# Patient Record
Sex: Female | Born: 1960
Health system: Southern US, Community
[De-identification: ages and names within clinical notes are randomized; demographics above are authoritative.]

## PROBLEM LIST (undated history)

## (undated) DIAGNOSIS — I10 Essential (primary) hypertension: Secondary | ICD-10-CM

---

## 2015-06-09 ENCOUNTER — Emergency Department (HOSPITAL_COMMUNITY): Payer: Self-pay

## 2015-06-09 ENCOUNTER — Observation Stay (HOSPITAL_COMMUNITY)
Admission: EM | Admit: 2015-06-09 | Discharge: 2015-06-12 | Disposition: A | Discharge status: Home / Self Care | Payer: Self-pay | Attending: Family Medicine | Admitting: Family Medicine

## 2015-06-09 ENCOUNTER — Encounter (HOSPITAL_COMMUNITY): Payer: Self-pay

## 2015-06-09 DIAGNOSIS — G459 Transient cerebral ischemic attack, unspecified: Principal | ICD-10-CM | POA: Diagnosis present

## 2015-06-09 DIAGNOSIS — Z66 Do not resuscitate: Secondary | ICD-10-CM | POA: Insufficient documentation

## 2015-06-09 DIAGNOSIS — E785 Hyperlipidemia, unspecified: Secondary | ICD-10-CM | POA: Insufficient documentation

## 2015-06-09 DIAGNOSIS — Q2112 Patent foramen ovale: Secondary | ICD-10-CM

## 2015-06-09 DIAGNOSIS — E669 Obesity, unspecified: Secondary | ICD-10-CM | POA: Insufficient documentation

## 2015-06-09 DIAGNOSIS — I10 Essential (primary) hypertension: Secondary | ICD-10-CM | POA: Insufficient documentation

## 2015-06-09 DIAGNOSIS — I161 Hypertensive emergency: Secondary | ICD-10-CM | POA: Insufficient documentation

## 2015-06-09 DIAGNOSIS — Z6831 Body mass index (BMI) 31.0-31.9, adult: Secondary | ICD-10-CM | POA: Insufficient documentation

## 2015-06-09 DIAGNOSIS — R2990 Unspecified symptoms and signs involving the nervous system: Secondary | ICD-10-CM | POA: Diagnosis present

## 2015-06-09 DIAGNOSIS — Q211 Atrial septal defect: Secondary | ICD-10-CM

## 2015-06-09 HISTORY — DX: Essential (primary) hypertension: I10

## 2015-06-09 LAB — DIFFERENTIAL
Basophils Absolute: 0 10*3/uL (ref 0.0–0.1)
Basophils Relative: 1 %
EOS PCT: 1 %
Eosinophils Absolute: 0.1 10*3/uL (ref 0.0–0.7)
LYMPHS ABS: 2.1 10*3/uL (ref 0.7–4.0)
LYMPHS PCT: 49 %
Monocytes Absolute: 0.3 10*3/uL (ref 0.1–1.0)
Monocytes Relative: 7 %
NEUTROS PCT: 42 %
Neutro Abs: 1.8 10*3/uL (ref 1.7–7.7)

## 2015-06-09 LAB — CBC
HCT: 37.3 % (ref 36.0–46.0)
HCT: 37 % (ref 36.0–46.0)
HEMOGLOBIN: 11.9 g/dL — AB (ref 12.0–15.0)
Hemoglobin: 12.2 g/dL (ref 12.0–15.0)
MCH: 28.6 pg (ref 26.0–34.0)
MCH: 29.4 pg (ref 26.0–34.0)
MCHC: 31.9 g/dL (ref 30.0–36.0)
MCHC: 33 g/dL (ref 30.0–36.0)
MCV: 89.7 fL (ref 78.0–100.0)
MCV: 89.2 fL (ref 78.0–100.0)
PLATELETS: 177 10*3/uL (ref 150–400)
Platelets: 182 10*3/uL (ref 150–400)
RBC: 4.16 MIL/uL (ref 3.87–5.11)
RBC: 4.15 MIL/uL (ref 3.87–5.11)
RDW: 13.5 % (ref 11.5–15.5)
RDW: 13.4 % (ref 11.5–15.5)
WBC: 4.3 10*3/uL (ref 4.0–10.5)
WBC: 4.8 10*3/uL (ref 4.0–10.5)

## 2015-06-09 LAB — RAPID URINE DRUG SCREEN, HOSP PERFORMED
AMPHETAMINES: NOT DETECTED
BARBITURATES: NOT DETECTED
Benzodiazepines: NOT DETECTED
Cocaine: NOT DETECTED
Opiates: NOT DETECTED
TETRAHYDROCANNABINOL: NOT DETECTED

## 2015-06-09 LAB — COMPREHENSIVE METABOLIC PANEL
ALBUMIN: 3.9 g/dL (ref 3.5–5.0)
ALK PHOS: 70 U/L (ref 38–126)
ALT: 22 U/L (ref 14–54)
ANION GAP: 10 (ref 5–15)
AST: 19 U/L (ref 15–41)
BILIRUBIN TOTAL: 0.5 mg/dL (ref 0.3–1.2)
BUN: 17 mg/dL (ref 6–20)
CALCIUM: 9.5 mg/dL (ref 8.9–10.3)
CO2: 24 mmol/L (ref 22–32)
CREATININE: 1.03 mg/dL — AB (ref 0.44–1.00)
Chloride: 107 mmol/L (ref 101–111)
GFR calc Af Amer: >60 mL/min (ref >60)
GFR calc non Af Amer: >60 mL/min (ref >60)
GLUCOSE: 108 mg/dL — AB (ref 65–99)
Potassium: 3.9 mmol/L (ref 3.5–5.1)
Sodium: 141 mmol/L (ref 135–145)
TOTAL PROTEIN: 7.4 g/dL (ref 6.5–8.1)

## 2015-06-09 LAB — URINALYSIS, ROUTINE W REFLEX MICROSCOPIC
BILIRUBIN URINE: NEGATIVE
Glucose, UA: NEGATIVE mg/dL
Hgb urine dipstick: NEGATIVE
KETONES UR: NEGATIVE mg/dL
NITRITE: NEGATIVE
PROTEIN: NEGATIVE mg/dL
Specific Gravity, Urine: 1.013 (ref 1.005–1.030)
pH: 6.5 (ref 5.0–8.0)

## 2015-06-09 LAB — PROTIME-INR
INR: 1 (ref 0.00–1.49)
PROTHROMBIN TIME: 13.4 s (ref 11.6–15.2)

## 2015-06-09 LAB — I-STAT CHEM 8, ED
BUN: 18 mg/dL (ref 6–20)
CREATININE: 1 mg/dL (ref 0.44–1.00)
Calcium, Ion: 1.23 mmol/L (ref 1.12–1.23)
Chloride: 106 mmol/L (ref 101–111)
Glucose, Bld: 108 mg/dL — ABNORMAL HIGH (ref 65–99)
HEMATOCRIT: 38 % (ref 36.0–46.0)
HEMOGLOBIN: 12.9 g/dL (ref 12.0–15.0)
Potassium: 4 mmol/L (ref 3.5–5.1)
SODIUM: 142 mmol/L (ref 135–145)
TCO2: 26 mmol/L (ref 0–100)

## 2015-06-09 LAB — URINE MICROSCOPIC-ADD ON: RBC / HPF: NONE SEEN RBC/hpf (ref 0–5)

## 2015-06-09 LAB — I-STAT TROPONIN, ED: Troponin i, poc: 0 ng/mL (ref 0.00–0.08)

## 2015-06-09 LAB — HEPATIC FUNCTION PANEL
ALBUMIN: 4.1 g/dL (ref 3.5–5.0)
ALT: 24 U/L (ref 14–54)
AST: 22 U/L (ref 15–41)
Alkaline Phosphatase: 72 U/L (ref 38–126)
Total Bilirubin: 0.6 mg/dL (ref 0.3–1.2)
Total Protein: 8 g/dL (ref 6.5–8.1)

## 2015-06-09 LAB — CK: Total CK: 169 U/L (ref 38–234)

## 2015-06-09 LAB — AMMONIA: Ammonia: 32 umol/L (ref 9–35)

## 2015-06-09 LAB — APTT: aPTT: 32 seconds (ref 24–37)

## 2015-06-09 LAB — I-STAT CG4 LACTIC ACID, ED
LACTIC ACID, VENOUS: 0.87 mmol/L (ref 0.5–2.0)
Lactic Acid, Venous: 0.64 mmol/L (ref 0.5–2.0)

## 2015-06-09 LAB — CREATININE, SERUM: CREATININE: 0.94 mg/dL (ref 0.44–1.00)

## 2015-06-09 LAB — ETHANOL

## 2015-06-09 MED ORDER — ONDANSETRON HCL 4 MG/2ML IJ SOLN: 4.0000 mg | Freq: Once | INTRAMUSCULAR | Status: DC

## 2015-06-09 MED ORDER — LABETALOL HCL 100 MG PO TABS
100.0000 mg | ORAL_TABLET | Freq: Two times a day (BID) | ORAL | Status: DC
  Administered 2015-06-09 – 2015-06-12 (×7): 100 mg via ORAL
  Filled 2015-06-09 (×7): qty 1

## 2015-06-09 MED ORDER — SENNOSIDES-DOCUSATE SODIUM 8.6-50 MG PO TABS: 1.0000 | ORAL_TABLET | Freq: Every evening | ORAL | Status: DC | PRN

## 2015-06-09 MED ORDER — HYDRALAZINE HCL 20 MG/ML IJ SOLN: 10.0000 mg | Freq: Four times a day (QID) | INTRAMUSCULAR | Status: DC | PRN

## 2015-06-09 MED ORDER — GADOBENATE DIMEGLUMINE 529 MG/ML IV SOLN
18.0000 mL | Freq: Once | INTRAVENOUS | Status: AC | PRN
  Administered 2015-06-09: 18 mL via INTRAVENOUS

## 2015-06-09 MED ORDER — ENOXAPARIN SODIUM 40 MG/0.4ML ~~LOC~~ SOLN
40.0000 mg | SUBCUTANEOUS | Status: DC
Start: 2015-06-09 — End: 2015-06-12
  Administered 2015-06-09 – 2015-06-11 (×3): 40 mg via SUBCUTANEOUS
  Filled 2015-06-09 (×3): qty 0.4

## 2015-06-09 MED ORDER — ACETAMINOPHEN 325 MG PO TABS
650.0000 mg | ORAL_TABLET | Freq: Four times a day (QID) | ORAL | Status: DC | PRN
  Administered 2015-06-09 – 2015-06-11 (×2): 650 mg via ORAL
  Filled 2015-06-09 (×2): qty 2

## 2015-06-09 MED ORDER — STROKE: EARLY STAGES OF RECOVERY BOOK
Freq: Once | Status: AC
  Administered 2015-06-10: 05:00:00
  Filled 2015-06-09 (×2): qty 1

## 2015-06-09 NOTE — Progress Notes (Signed)
Patient admitted to the unit from ER. Patient alert and oriented x 4. Patient oriented to room amd made comfortable. Tele placed.

## 2015-06-09 NOTE — ED Notes (Signed)
Pt. Woke up at 330 am and had a coughing spell went to bed and then again woke up this am and had another coughing spell.  She then had her friend check her BP and it was elevated and then the symptoms began around 10:35.  She was having lt. Arm numbness and lt. Lateral neck tightness and also had an episode of not able to talk or smile.  When pt. Arrived she reports that all of her symptoms have resolved.  She does report having dizziness with movement. She also has an area on her lt. Cheek that feels tight.Marland Kitchen. Speech is clear Moving all extremeties. Alert and oriented X 4.  Dr. Madilyn Hookees at the bedside.,

## 2015-06-09 NOTE — ED Provider Notes (Signed)
CSN: [id number]     Arrival date & time 06/09/15  1142 History   First MD Initiated Contact with Patient 06/09/15 1152     Chief Complaint  Patient presents with  . Numbness     The history is provided by the patient and the EMS personnel. No language interpreter was used.   Dorothy RabonRene Vassey is a 55 y.o. female who presents to the Emergency Department complaining of numbness.  At 3:30 this morning she awoke with a coughing spell that that resolved and she went back to sleep. She awoke again about 9:30 with a second coughing fits. She was concerned this may be related to her blood pressure and had it checked by her friend and it was noted to be 185/110. About 10:30 she developed what she describes as a crick in her neck where she could not move her neck to the side. Along with that sensation she had numbness and weakness in her left face, arm, leg. She had difficulty speaking at that time. Symptoms have resolved by the time she came to the emergency department except for a slight funny feeling in her left cheek. She does have some associated nausea and dizziness. She has a history of high blood pressure that she treats with homeopathic agents since she has a history of adrenal problems and allergy to antihypertensives.  No past medical history on file. No past surgical history on file. No family history on file. Social History  Substance Use Topics  . Smoking status: Not on file  . Smokeless tobacco: Not on file  . Alcohol Use: Not on file   OB History    No data available     Review of Systems  All other systems reviewed and are negative.     Allergies  Iodine; Latex; and Penicillins  Home Medications   Prior to Admission medications   Not on File   BP 223/125 mmHg  Pulse 62  Temp(Src) 98.1 F (36.7 C) (Oral)  Resp 16  SpO2 99%  LMP  (LMP Unknown) Physical Exam  Constitutional: She is oriented to person, place, and time. She appears well-developed and well-nourished.   HENT:  Head: Normocephalic and atraumatic.  Eyes: EOM are normal. Pupils are equal, round, and reactive to light.  Cardiovascular: Normal rate and regular rhythm.   No murmur heard. Pulmonary/Chest: Effort normal and breath sounds normal. No respiratory distress.  Abdominal: Soft. There is no tenderness. There is no rebound and no guarding.  Musculoskeletal: She exhibits no edema or tenderness.  Neurological: She is alert and oriented to person, place, and time. No cranial nerve deficit. Coordination normal.  5 out of 5 strength in all 4 extremities. Sensation to light touch intact in all 4 extremities.  Skin: Skin is warm and dry.  Psychiatric: She has a normal mood and affect. Her behavior is normal.  Nursing note and vitals reviewed.   ED Course  Procedures (including critical care time) Labs Review Labs Reviewed  CBC - Abnormal; Notable for the following:    Hemoglobin 11.9 (*)    All other components within normal limits  COMPREHENSIVE METABOLIC PANEL - Abnormal; Notable for the following:    Glucose, Bld 108 (*)    Creatinine, Ser 1.03 (*)    All other components within normal limits  URINALYSIS, ROUTINE W REFLEX MICROSCOPIC (NOT [facility]) - Abnormal; Notable for the following:    Leukocytes, UA TRACE (*)    All other components within normal limits  HEPATIC FUNCTION  PANEL - Abnormal; Notable for the following:    Bilirubin, Direct <0.1 (*)    All other components within normal limits  URINE MICROSCOPIC-ADD ON - Abnormal; Notable for the following:    Squamous Epithelial / LPF 6-30 (*)    Bacteria, UA FEW (*)    All other components within normal limits  I-STAT CHEM 8, ED - Abnormal; Notable for the following:    Glucose, Bld 108 (*)    All other components within normal limits  ETHANOL  PROTIME-INR  APTT  DIFFERENTIAL  URINE RAPID DRUG SCREEN, HOSP PERFORMED  AMMONIA  CK  I-STAT TROPOININ, ED  I-STAT CG4 LACTIC ACID, ED  I-STAT CG4 LACTIC ACID, ED    Imaging  Review Dg Chest 2 View  06/09/2015  CLINICAL DATA:  Possible TIA this morning, eyes rolling into back of head, LEFT face and arm numbness, cough EXAM: CHEST  2 VIEW COMPARISON:  None FINDINGS: Upper normal heart size. Mediastinal contours and pulmonary vascularity normal. Atherosclerotic calcification aorta. Faint density projecting over LEFT upper lobe likely an EKG lead. No definite infiltrate, pleural effusion or pneumothorax. Bones unremarkable. IMPRESSION: No acute abnormalities. Electronically Signed   By: Ulyses Southward M.D.   On: 06/09/2015 14:31   Ct Head Wo Contrast  06/09/2015  CLINICAL DATA:  Left arm weakness and left-sided facial tingling beginning approximately at 3:30 this morning. Severe hypertension noted at the time of presentation. EXAM: CT HEAD WITHOUT CONTRAST TECHNIQUE: Contiguous axial images were obtained from the base of the skull through the vertex without intravenous contrast. COMPARISON:  None. FINDINGS: Examination is degraded secondary to patient motion artifact necessitating the acquisition of additional images. Gray-white differentiation is maintained. No CT evidence of acute large territory infarct. No intraparenchymal or extra-axial mass or hemorrhage. Normal size and configuration of the ventricles and basilar cisterns. No midline shift. There is underpneumatization of the bilateral frontal sinuses. The remaining paranasal sinuses and mastoid air cells are normally aerated. No air-fluid levels. Regional soft tissues appear normal. No displaced calvarial fracture. IMPRESSION: Negative noncontrast head CT. Electronically Signed   By: Simonne Come M.D.   On: 06/09/2015 12:35   Mr Maxine Glenn Head Wo Contrast  06/09/2015  CLINICAL DATA:  Left arm numbness and left neck pain beginning 6 hours ago. Some dizziness. EXAM: MR HEAD WITHOUT CONTRAST MR CIRCLE OF WILLIS WITHOUT CONTRAST MRA OF THE NECK WITHOUT AND WITH CONTRAST TECHNIQUE: Multiplanar, multiecho pulse sequences of the brain and  surrounding structures were obtained according to standard protocol without intravenous contrast.; Multiplanar and multiecho pulse sequences of the neck were obtained without and with intravenous contrast. Angiographic images of the neck were obtained using MRA technique without and with intravenous contast.; Angiographic images of the Circle of Willis were obtained using MRA technique without intravenous contrast. CONTRAST:  18mL MULTIHANCE GADOBENATE DIMEGLUMINE 529 MG/ML IV SOLN COMPARISON:  CT same day FINDINGS: MR HEAD FINDINGS Diffusion imaging does not show any acute or subacute infarction. There are mild chronic appearing small vessel changes of the brainstem, thalami and hemispheric white matter. Punctate foci of hemosiderin are present in the region of those infarctions. No cortical or large vessel territory infarction. No mass lesion, acute hemorrhage, hydrocephalus or extra-axial collection. No pituitary mass. No inflammatory sinus disease. MR CIRCLE OF WILLIS FINDINGS Both internal carotid arteries are widely patent into the brain. No siphon stenosis. The anterior and middle cerebral vessels are normal without proximal stenosis, aneurysm or vascular malformation. Both vertebral arteries are normal with the left  being dominant. No basilar stenosis. Posterior circulation branch vessels are normal. Right PCA takes a fetal origin from the anterior circulation. MRA NECK FINDINGS Branching pattern of the brachiocephalic vessels from the arch is normal. No origin stenosis. Both common carotid arteries are widely patent to their respective bifurcation. No carotid bifurcation narrowing or irregularity. Cervical internal carotid arteries are normal. Both vertebral arteries are normal without origin stenosis. No narrowing or irregularity. IMPRESSION: No acute brain finding. Chronic small-vessel ischemic changes of a mild degree. Punctate hemosiderin deposition associated with many of the old small vessel insults.  No sign of acute hemorrhage. Normal intracranial MR angiography of the large and medium size vessels. Normal MR angiography of the neck vessels. Electronically Signed   By: Paulina Fusi M.D.   On: 06/09/2015 14:32   Mr Angiogram Neck W Wo Contrast  06/09/2015  CLINICAL DATA:  Left arm numbness and left neck pain beginning 6 hours ago. Some dizziness. EXAM: MR HEAD WITHOUT CONTRAST MR CIRCLE OF WILLIS WITHOUT CONTRAST MRA OF THE NECK WITHOUT AND WITH CONTRAST TECHNIQUE: Multiplanar, multiecho pulse sequences of the brain and surrounding structures were obtained according to standard protocol without intravenous contrast.; Multiplanar and multiecho pulse sequences of the neck were obtained without and with intravenous contrast. Angiographic images of the neck were obtained using MRA technique without and with intravenous contast.; Angiographic images of the Circle of Willis were obtained using MRA technique without intravenous contrast. CONTRAST:  18mL MULTIHANCE GADOBENATE DIMEGLUMINE 529 MG/ML IV SOLN COMPARISON:  CT same day FINDINGS: MR HEAD FINDINGS Diffusion imaging does not show any acute or subacute infarction. There are mild chronic appearing small vessel changes of the brainstem, thalami and hemispheric white matter. Punctate foci of hemosiderin are present in the region of those infarctions. No cortical or large vessel territory infarction. No mass lesion, acute hemorrhage, hydrocephalus or extra-axial collection. No pituitary mass. No inflammatory sinus disease. MR CIRCLE OF WILLIS FINDINGS Both internal carotid arteries are widely patent into the brain. No siphon stenosis. The anterior and middle cerebral vessels are normal without proximal stenosis, aneurysm or vascular malformation. Both vertebral arteries are normal with the left being dominant. No basilar stenosis. Posterior circulation branch vessels are normal. Right PCA takes a fetal origin from the anterior circulation. MRA NECK FINDINGS  Branching pattern of the brachiocephalic vessels from the arch is normal. No origin stenosis. Both common carotid arteries are widely patent to their respective bifurcation. No carotid bifurcation narrowing or irregularity. Cervical internal carotid arteries are normal. Both vertebral arteries are normal without origin stenosis. No narrowing or irregularity. IMPRESSION: No acute brain finding. Chronic small-vessel ischemic changes of a mild degree. Punctate hemosiderin deposition associated with many of the old small vessel insults. No sign of acute hemorrhage. Normal intracranial MR angiography of the large and medium size vessels. Normal MR angiography of the neck vessels. Electronically Signed   By: Paulina Fusi M.D.   On: 06/09/2015 14:32   Mr Brain Wo Contrast  06/09/2015  CLINICAL DATA:  Left arm numbness and left neck pain beginning 6 hours ago. Some dizziness. EXAM: MR HEAD WITHOUT CONTRAST MR CIRCLE OF WILLIS WITHOUT CONTRAST MRA OF THE NECK WITHOUT AND WITH CONTRAST TECHNIQUE: Multiplanar, multiecho pulse sequences of the brain and surrounding structures were obtained according to standard protocol without intravenous contrast.; Multiplanar and multiecho pulse sequences of the neck were obtained without and with intravenous contrast. Angiographic images of the neck were obtained using MRA technique without and with intravenous contast.; Angiographic images  of the Circle of Willis were obtained using MRA technique without intravenous contrast. CONTRAST:  18mL MULTIHANCE GADOBENATE DIMEGLUMINE 529 MG/ML IV SOLN COMPARISON:  CT same day FINDINGS: MR HEAD FINDINGS Diffusion imaging does not show any acute or subacute infarction. There are mild chronic appearing small vessel changes of the brainstem, thalami and hemispheric white matter. Punctate foci of hemosiderin are present in the region of those infarctions. No cortical or large vessel territory infarction. No mass lesion, acute hemorrhage, hydrocephalus  or extra-axial collection. No pituitary mass. No inflammatory sinus disease. MR CIRCLE OF WILLIS FINDINGS Both internal carotid arteries are widely patent into the brain. No siphon stenosis. The anterior and middle cerebral vessels are normal without proximal stenosis, aneurysm or vascular malformation. Both vertebral arteries are normal with the left being dominant. No basilar stenosis. Posterior circulation branch vessels are normal. Right PCA takes a fetal origin from the anterior circulation. MRA NECK FINDINGS Branching pattern of the brachiocephalic vessels from the arch is normal. No origin stenosis. Both common carotid arteries are widely patent to their respective bifurcation. No carotid bifurcation narrowing or irregularity. Cervical internal carotid arteries are normal. Both vertebral arteries are normal without origin stenosis. No narrowing or irregularity. IMPRESSION: No acute brain finding. Chronic small-vessel ischemic changes of a mild degree. Punctate hemosiderin deposition associated with many of the old small vessel insults. No sign of acute hemorrhage. Normal intracranial MR angiography of the large and medium size vessels. Normal MR angiography of the neck vessels. Electronically Signed   By: Paulina Fusi M.D.   On: 06/09/2015 14:32   I have personally reviewed and evaluated these images and lab results as part of my medical decision-making.   EKG Interpretation   Date/Time:  Saturday June 09 2015 11:57:45 EST Ventricular Rate:  69 PR Interval:  163 QRS Duration: 96 QT Interval:  434 QTC Calculation: 465 R Axis:   57 Text Interpretation:  Sinus rhythm Probable left atrial enlargement Left  ventricular hypertrophy Anterior ST elevation, probably due to LVH  Baseline wander in lead(s) V3 Confirmed by Lincoln Brigham [phone]) on 06/09/2015  2:42:38 PM      MDM   Final diagnoses:  Stroke-like symptoms    Patient here for evaluation of left sided weakness that resolved on ED  arrival. She did have an altered to some GERD sensation in her left face that has continued to resolve over time. She has an NIH score of 0. She was markedly hypertensive on ED arrival and her blood pressure continues to improve. She did present with some nausea and dizziness and these symptoms have also improved during her ED stay. The patient has been evaluated by Dr. Lavon Paganini with the neurology service, plan to admit for observation for TIA workup and continue to follow her blood pressures.  Tilden Fossa, MD 06/09/15 1535

## 2015-06-09 NOTE — ED Notes (Signed)
Pt. Eating peanut butter and crackers, cold water also given

## 2015-06-09 NOTE — ED Notes (Signed)
Dr.  Burnis Kingfisherizwan  Gave a verbal order to go ahead and give her labetalol 100 mg po now , (The dose was ordered for 2200)

## 2015-06-09 NOTE — ED Notes (Signed)
Pt. Ambulated in the room for Dr. Lavon PaganiniNandigam, Pt.s gait was steady, Pt. Denies any dizziness

## 2015-06-09 NOTE — H&P (Addendum)
[facility] History and Physical  Azuri Bozard WUJ:[id number] DOB: 1960-12-23 DOA: 06/09/2015   PCP: No primary care provider on file.    Chief Complaint: left sided numbness and weakness  HPI: Mitsuye Schrodt is a 55 y.o. female with HTN who takes holistic medications presents with an onset of loss of consciousness, left face arm and leg numbness and weakness. The patient was with her friends and did not feel well early this morning. They checked her blood pressure and noted that systolic was greater than 190. They were assisting her and laying down in bed when she complained of numbness of her left face followed by numbness and tingling of her left arm. Subsequently, her eyes rolled back and she became semi-conscious. She does not remember what happened after this. Her friends noted left facial droop and flaccidity and left arm and left leg. EMS was called and she was brought to the hospital. By the time she arrived to the ER her symptoms had resolved. Currently she is asymptomatic other than a mild headache. She has high blood pressure and it has been under control with holistic medicines up until 3 weeks ago when she started noticing that it was elevated. No history of stroke or MI in the past.      General: The patient denies anorexia, fever, + 20 pound weight gain in about 4 months Cardiac: Denies chest pain, syncope,pedal edema - she often has palpitations- she had some chest discomfort about a month ago but not recently Respiratory: She has had cough with congestion and runny nose for the past few days but as of today these symptoms have resolved GI: Denies severe indigestion/heartburn, abdominal pain, vomiting, diarrhea and constipation- she has had nausea but no vomiting or diarrhea GU: Denies hematuria, incontinence, dysuria  Musculoskeletal: Denies arthritis  Skin: Denies suspicious skin lesions Neurologic: Per history of present illness Psychiatry: Denies depression or  anxiety. Hematologic: no easy bruising or bleeding  All other systems reviewed and found to be negative.  No past medical history on file.  No past surgical history on file.  Social History: She does not smoke or drink alcohol Currently is living with her friend and is not working. Allergies  Allergen Reactions  . Iodine Anaphylaxis  . Latex Anaphylaxis  . Penicillins Anaphylaxis  Lisinopril causes anaphylactic shock Amlodipine makes her feel bad  Family history:  Both of her parents are diabetics    Prior to Admission medications - she takes a number of holistic medications which include magnesium   Not on File     Physical Exam: Filed Vitals:   06/09/15 1155 06/09/15 1158 06/09/15 1245 06/09/15 1409  BP: 224/117 224/117 223/125   Pulse: 71 67 62   Temp: 98 F (36.7 C)   98.1 F (36.7 C)  TempSrc: Oral     Resp: SpO2: 99% 99% 99%      General: Awake alert oriented 3, no acute distress HEENT: Normocephalic and Atraumatic, Mucous membranes pink                PERRLA; EOM intact; No scleral icterus,                 Nares: Patent, Oropharynx: Clear, Fair Dentition                 Neck: FROM, no cervical lymphadenopathy, thyromegaly, carotid bruit or JVD;  Breasts: deferred CHEST WALL: No tenderness  CHEST: Normal respiration, clear to auscultation bilaterally  HEART: Regular rate and rhythm; no murmurs rubs or gallops  BACK: No kyphosis or scoliosis; no CVA tenderness  GI: Positive Bowel Sounds, soft, non-tender; no masses, no organomegaly Rectal Exam: deferred MSK: No cyanosis, clubbing, or edema Genitalia: not examined  SKIN:  no rash or ulceration  CNS: Alert and Oriented x 4, Nonfocal exam, CN 2-12 intact  Labs on Admission:  Basic Metabolic Panel:  Recent Labs Lab 06/09/15 1248 06/09/15 1259  NA 141 142  K 3.9 4.0  CL 107 106  CO2 24  --   GLUCOSE 108* 108*  BUN 17 18  CREATININE 1.03* 1.00  CALCIUM 9.5  --    Liver Function  Tests:  Recent Labs Lab 06/09/15 1248  AST 19  22  ALT 22  24  ALKPHOS 70  72  BILITOT 0.5  0.6  PROT 7.4  8.0  ALBUMIN 3.9  4.1   No results for input(s): LIPASE, AMYLASE in the last 168 hours.  Recent Labs Lab 06/09/15 1248  AMMONIA 32   CBC:  Recent Labs Lab 06/09/15 1248 06/09/15 1259  WBC 4.3  --   NEUTROABS 1.8  --   HGB 11.9* 12.9  HCT 37.3 38.0  MCV 89.7  --   PLT 182  --    Cardiac Enzymes:  Recent Labs Lab 06/09/15 1248  CKTOTAL 169    BNP (last 3 results) No results for input(s): BNP in the last 8760 hours.  ProBNP (last 3 results) No results for input(s): PROBNP in the last 8760 hours.  CBG: No results for input(s): GLUCAP in the last 168 hours.  Radiological Exams on Admission: Dg Chest 2 View  06/09/2015  CLINICAL DATA:  Possible TIA this morning, eyes rolling into back of head, LEFT face and arm numbness, cough EXAM: CHEST  2 VIEW COMPARISON:  None FINDINGS: Upper normal heart size. Mediastinal contours and pulmonary vascularity normal. Atherosclerotic calcification aorta. Faint density projecting over LEFT upper lobe likely an EKG lead. No definite infiltrate, pleural effusion or pneumothorax. Bones unremarkable. IMPRESSION: No acute abnormalities. Electronically Signed   By: Ulyses SouthwardMark  Boles M.D.   On: 06/09/2015 14:31   Ct Head Wo Contrast  06/09/2015  CLINICAL DATA:  Left arm weakness and left-sided facial tingling beginning approximately at 3:30 this morning. Severe hypertension noted at the time of presentation. EXAM: CT HEAD WITHOUT CONTRAST TECHNIQUE: Contiguous axial images were obtained from the base of the skull through the vertex without intravenous contrast. COMPARISON:  None. FINDINGS: Examination is degraded secondary to patient motion artifact necessitating the acquisition of additional images. Gray-white differentiation is maintained. No CT evidence of acute large territory infarct. No intraparenchymal or extra-axial mass or  hemorrhage. Normal size and configuration of the ventricles and basilar cisterns. No midline shift. There is underpneumatization of the bilateral frontal sinuses. The remaining paranasal sinuses and mastoid air cells are normally aerated. No air-fluid levels. Regional soft tissues appear normal. No displaced calvarial fracture. IMPRESSION: Negative noncontrast head CT. Electronically Signed   By: Simonne ComeJohn  Watts M.D.   On: 06/09/2015 12:35   Mr Maxine GlennMra Head Wo Contrast  06/09/2015  CLINICAL DATA:  Left arm numbness and left neck pain beginning 6 hours ago. Some dizziness. EXAM: MR HEAD WITHOUT CONTRAST MR CIRCLE OF WILLIS WITHOUT CONTRAST MRA OF THE NECK WITHOUT AND WITH CONTRAST TECHNIQUE: Multiplanar, multiecho pulse sequences of the brain and surrounding structures were obtained according to standard protocol without intravenous contrast.; Multiplanar and multiecho pulse sequences of the neck were obtained  without and with intravenous contrast. Angiographic images of the neck were obtained using MRA technique without and with intravenous contast.; Angiographic images of the Circle of Willis were obtained using MRA technique without intravenous contrast. CONTRAST:  18mL MULTIHANCE GADOBENATE DIMEGLUMINE 529 MG/ML IV SOLN COMPARISON:  CT same day FINDINGS: MR HEAD FINDINGS Diffusion imaging does not show any acute or subacute infarction. There are mild chronic appearing small vessel changes of the brainstem, thalami and hemispheric white matter. Punctate foci of hemosiderin are present in the region of those infarctions. No cortical or large vessel territory infarction. No mass lesion, acute hemorrhage, hydrocephalus or extra-axial collection. No pituitary mass. No inflammatory sinus disease. MR CIRCLE OF WILLIS FINDINGS Both internal carotid arteries are widely patent into the brain. No siphon stenosis. The anterior and middle cerebral vessels are normal without proximal stenosis, aneurysm or vascular malformation. Both  vertebral arteries are normal with the left being dominant. No basilar stenosis. Posterior circulation branch vessels are normal. Right PCA takes a fetal origin from the anterior circulation. MRA NECK FINDINGS Branching pattern of the brachiocephalic vessels from the arch is normal. No origin stenosis. Both common carotid arteries are widely patent to their respective bifurcation. No carotid bifurcation narrowing or irregularity. Cervical internal carotid arteries are normal. Both vertebral arteries are normal without origin stenosis. No narrowing or irregularity. IMPRESSION: No acute brain finding. Chronic small-vessel ischemic changes of a mild degree. Punctate hemosiderin deposition associated with many of the old small vessel insults. No sign of acute hemorrhage. Normal intracranial MR angiography of the large and medium size vessels. Normal MR angiography of the neck vessels. Electronically Signed   By: Paulina Fusi M.D.   On: 06/09/2015 14:32   Mr Angiogram Neck W Wo Contrast  06/09/2015  CLINICAL DATA:  Left arm numbness and left neck pain beginning 6 hours ago. Some dizziness. EXAM: MR HEAD WITHOUT CONTRAST MR CIRCLE OF WILLIS WITHOUT CONTRAST MRA OF THE NECK WITHOUT AND WITH CONTRAST TECHNIQUE: Multiplanar, multiecho pulse sequences of the brain and surrounding structures were obtained according to standard protocol without intravenous contrast.; Multiplanar and multiecho pulse sequences of the neck were obtained without and with intravenous contrast. Angiographic images of the neck were obtained using MRA technique without and with intravenous contast.; Angiographic images of the Circle of Willis were obtained using MRA technique without intravenous contrast. CONTRAST:  18mL MULTIHANCE GADOBENATE DIMEGLUMINE 529 MG/ML IV SOLN COMPARISON:  CT same day FINDINGS: MR HEAD FINDINGS Diffusion imaging does not show any acute or subacute infarction. There are mild chronic appearing small vessel changes of the  brainstem, thalami and hemispheric white matter. Punctate foci of hemosiderin are present in the region of those infarctions. No cortical or large vessel territory infarction. No mass lesion, acute hemorrhage, hydrocephalus or extra-axial collection. No pituitary mass. No inflammatory sinus disease. MR CIRCLE OF WILLIS FINDINGS Both internal carotid arteries are widely patent into the brain. No siphon stenosis. The anterior and middle cerebral vessels are normal without proximal stenosis, aneurysm or vascular malformation. Both vertebral arteries are normal with the left being dominant. No basilar stenosis. Posterior circulation branch vessels are normal. Right PCA takes a fetal origin from the anterior circulation. MRA NECK FINDINGS Branching pattern of the brachiocephalic vessels from the arch is normal. No origin stenosis. Both common carotid arteries are widely patent to their respective bifurcation. No carotid bifurcation narrowing or irregularity. Cervical internal carotid arteries are normal. Both vertebral arteries are normal without origin stenosis. No narrowing or irregularity. IMPRESSION: No  acute brain finding. Chronic small-vessel ischemic changes of a mild degree. Punctate hemosiderin deposition associated with many of the old small vessel insults. No sign of acute hemorrhage. Normal intracranial MR angiography of the large and medium size vessels. Normal MR angiography of the neck vessels. Electronically Signed   By: Paulina Fusi M.D.   On: 06/09/2015 14:32   Mr Brain Wo Contrast  06/09/2015  CLINICAL DATA:  Left arm numbness and left neck pain beginning 6 hours ago. Some dizziness. EXAM: MR HEAD WITHOUT CONTRAST MR CIRCLE OF WILLIS WITHOUT CONTRAST MRA OF THE NECK WITHOUT AND WITH CONTRAST TECHNIQUE: Multiplanar, multiecho pulse sequences of the brain and surrounding structures were obtained according to standard protocol without intravenous contrast.; Multiplanar and multiecho pulse sequences of  the neck were obtained without and with intravenous contrast. Angiographic images of the neck were obtained using MRA technique without and with intravenous contast.; Angiographic images of the Circle of Willis were obtained using MRA technique without intravenous contrast. CONTRAST:  18mL MULTIHANCE GADOBENATE DIMEGLUMINE 529 MG/ML IV SOLN COMPARISON:  CT same day FINDINGS: MR HEAD FINDINGS Diffusion imaging does not show any acute or subacute infarction. There are mild chronic appearing small vessel changes of the brainstem, thalami and hemispheric white matter. Punctate foci of hemosiderin are present in the region of those infarctions. No cortical or large vessel territory infarction. No mass lesion, acute hemorrhage, hydrocephalus or extra-axial collection. No pituitary mass. No inflammatory sinus disease. MR CIRCLE OF WILLIS FINDINGS Both internal carotid arteries are widely patent into the brain. No siphon stenosis. The anterior and middle cerebral vessels are normal without proximal stenosis, aneurysm or vascular malformation. Both vertebral arteries are normal with the left being dominant. No basilar stenosis. Posterior circulation branch vessels are normal. Right PCA takes a fetal origin from the anterior circulation. MRA NECK FINDINGS Branching pattern of the brachiocephalic vessels from the arch is normal. No origin stenosis. Both common carotid arteries are widely patent to their respective bifurcation. No carotid bifurcation narrowing or irregularity. Cervical internal carotid arteries are normal. Both vertebral arteries are normal without origin stenosis. No narrowing or irregularity. IMPRESSION: No acute brain finding. Chronic small-vessel ischemic changes of a mild degree. Punctate hemosiderin deposition associated with many of the old small vessel insults. No sign of acute hemorrhage. Normal intracranial MR angiography of the large and medium size vessels. Normal MR angiography of the neck vessels.  Electronically Signed   By: Paulina Fusi M.D.   On: 06/09/2015 14:32    EKG: Independently reviewed. NSR with no ST or T wave changes  Assessment/Plan Principal Problem:   Stroke-like symptoms -Possibly TIA secondary to hypertension-MRI is negative for CVA -We'll continue neuro checks and follow-up on echo and carotid duplex -Check A1c and lipid panel  Active Problems:   Hypertensive emergency As she states she is allergic to lisinopril and amlodipine, we'll start labetalol and also order when necessary hydralazine goal blood pressure would be 160-180 systolic at this time   Consulted: Neurology consulted by ER  Code Status: DO NOT RESUSCITATE Family Communication:  DVT Prophylaxis: Lovenox  Time spent: 15  Violanda Bobeck, MD [facility]  If 7PM-7AM, please contact night-coverage [email or web address] 06/09/2015, 3:42 PM

## 2015-06-09 NOTE — Consult Note (Signed)
Requesting Physician: Dr. Madilyn Hook     Reason for consultation: Left facial and limb numbness Dorothy Gilbert, evaluate for stroke   HPI:                                                                                                                                         Dorothy Gilbert is an 55 y.o. female patient who presented The emergency room with symptoms of left face, upper and lower extremities weakness and numbness symptoms. She is not sure what time the symptoms started. She did not feel well when she woke up this morning and had some dizziness and numbness in her face. She woke up around 3:30 AM to go to bathroom and she felt normal at that time. The symptoms got worse roughly around 9 AM. Her friend checked her blood pressure at home which was highly elevated to 190 systolic. She also has some dizziness and presyncopal type symptoms. She has known history of hypertension but does not take any medications, instead she is on several supplemental and alternative medical therapies. Denies any vision or speech problems. Her symptoms are mostly resolved at the time of my evaluation.   Date last known well:  06/09/15 Time last known well:  Possibly around 3.30 am tPA Given: No: Non focal exam, unknown clear onset time, woke up with symptoms.   Stroke Risk Factors - hypertension  Past Medical History: No past medical history on file.  No past surgical history on file.  Family History: No family history on file.  Social History:   has no tobacco, alcohol, and drug history on file.  Allergies:  Allergies  Allergen Reactions  . Iodine Anaphylaxis  . Latex Anaphylaxis  . Penicillins Anaphylaxis     Medications:                                                                                                                         Current facility-administered medications:  .  ondansetron (ZOFRAN) injection 4 mg, 4 mg, Intravenous, Once, Tilden Fossa, MD No current outpatient prescriptions  on file.   ROS:  History obtained from the patient  General ROS: negative for - chills, fatigue, fever, night sweats, weight gain or weight loss Psychological ROS: negative for - behavioral disorder, hallucinations, memory difficulties, mood swings or suicidal ideation Ophthalmic ROS: negative for - blurry vision, double vision, eye pain or loss of vision ENT ROS: negative for - epistaxis, nasal discharge, oral lesions, sore throat, tinnitus or vertigo Allergy and Immunology ROS: negative for - hives or itchy/watery eyes Hematological and Lymphatic ROS: negative for - bleeding problems, bruising or swollen lymph nodes Endocrine ROS: negative for - galactorrhea, hair pattern changes, polydipsia/polyuria or temperature intolerance Respiratory ROS: negative for - cough, hemoptysis, shortness of breath or wheezing Cardiovascular ROS: negative for - chest pain, dyspnea on exertion, edema or irregular heartbeat Gastrointestinal ROS: negative for - abdominal pain, diarrhea, hematemesis, nausea/vomiting or stool incontinence Genito-Urinary ROS: negative for - dysuria, hematuria, incontinence or urinary frequency/urgency Musculoskeletal ROS: negative for - joint swelling or muscular weakness Neurological ROS: as noted in HPI Dermatological ROS: negative for rash and skin lesion changes  Neurologic Examination:                                                                                                      Blood pressure 224/117, pulse 71, temperature 98 F (36.7 C), temperature source Oral, resp. rate 18, SpO2 99 %.  Evaluation of higher integrative functions including: Level of alertness: Alert,  Oriented to time, place and person Speech: fluent, no evidence of dysarthria or aphasia noted.  Test the following cranial nerves: 2-12 grossly intact Motor  examination: Normal tone, bulk, full 5/5 motor strength in all 4 extremities Examination of sensation : Normal and symmetric sensation to pinprick in all 4 extremities and on face Examination of deep tendon reflexes: 2+, normal and symmetric in all extremities, normal plantars bilaterally Test coordination: Normal finger nose testing, with no evidence of limb appendicular ataxia or abnormal involuntary movements or tremors noted.  Gait:Within normal limits  Lab Results: Basic Metabolic Panel: No results for input(s): NA, K, CL, CO2, GLUCOSE, BUN, CREATININE, CALCIUM, MG, PHOS in the last 168 hours.  Liver Function Tests: No results for input(s): AST, ALT, ALKPHOS, BILITOT, PROT, ALBUMIN in the last 168 hours. No results for input(s): LIPASE, AMYLASE in the last 168 hours. No results for input(s): AMMONIA in the last 168 hours.  CBC: No results for input(s): WBC, NEUTROABS, HGB, HCT, MCV, PLT in the last 168 hours.  Cardiac Enzymes: No results for input(s): CKTOTAL, CKMB, CKMBINDEX, TROPONINI in the last 168 hours.  Lipid Panel: No results for input(s): CHOL, TRIG, HDL, CHOLHDL, VLDL, LDLCALC in the last 168 hours.  CBG: No results for input(s): GLUCAP in the last 168 hours.  Microbiology: No results found for this or any previous visit.   Imaging: Ct Head Wo Contrast  06/09/2015  CLINICAL DATA:  Left arm weakness and left-sided facial tingling beginning approximately at 3:30 this morning. Severe hypertension noted at the time of presentation. EXAM: CT HEAD WITHOUT CONTRAST TECHNIQUE: Contiguous axial images were obtained from the base of the skull through the  vertex without intravenous contrast. COMPARISON:  None. FINDINGS: Examination is degraded secondary to patient motion artifact necessitating the acquisition of additional images. Gray-white differentiation is maintained. No CT evidence of acute large territory infarct. No intraparenchymal or extra-axial mass or hemorrhage. Normal  size and configuration of the ventricles and basilar cisterns. No midline shift. There is underpneumatization of the bilateral frontal sinuses. The remaining paranasal sinuses and mastoid air cells are normally aerated. No air-fluid levels. Regional soft tissues appear normal. No displaced calvarial fracture. IMPRESSION: Negative noncontrast head CT. Electronically Signed   By: Simonne ComeJohn  Watts M.D.   On: 06/09/2015 12:35    Assessment and plan:   Dorothy Gilbert is an 55 y.o. female patient who presented with  history of left face and left upper and lower extremities numbness and weakness Symptoms earlier today, which have resolved at the time of my evaluation in the emergency room. Nonfocal neurological examination. Not a candidate for acute IV TPA since her neurological examination is nonfocal.  Her stroke risk factors would include poorly controlled hypertension for which she does not use any medications. She is on several supplemental and alternative treatments. Does not take aspirin at home. CT of the head was unremarkable.  Given her vascular risk factors, could possibly represent a TIA.  Recommend further workup with a brain MRI, and MRA of the head and neck.  At the time of finalizing this note, the studies are completed. They did not show any acute pathology. Chronic hypertensive microvascular ischemic changes were seen on the brain MRI study. Recommend starting aspirin 81 mg daily, and optimal management of her blood pressure. Check A1c, TSH, liver function tests, CK, lactate and lipid profile. Will be admitted to hospitalist service, telemetry monitoring overnight and echocardiogram. Physical therapy evaluation prior to discharge.  We'll follow up.

## 2015-06-09 NOTE — ED Notes (Signed)
Pt. Denies any dizziness or nausea at the present time

## 2015-06-09 NOTE — ED Notes (Signed)
Dr. Lavon PaganiniNandigam at the bedside

## 2015-06-10 ENCOUNTER — Observation Stay (HOSPITAL_BASED_OUTPATIENT_CLINIC_OR_DEPARTMENT_OTHER): Payer: Self-pay

## 2015-06-10 DIAGNOSIS — G459 Transient cerebral ischemic attack, unspecified: Secondary | ICD-10-CM

## 2015-06-10 LAB — LIPID PANEL
CHOL/HDL RATIO: 3.5 ratio
CHOLESTEROL: 195 mg/dL (ref 0–200)
HDL: 55 mg/dL (ref >40)
LDL CALC: 121 mg/dL — AB (ref 0–99)
Triglycerides: 93 mg/dL (ref <150)
VLDL: 19 mg/dL (ref 0–40)

## 2015-06-10 LAB — TSH: TSH: 1.365 u[IU]/mL (ref 0.350–4.500)

## 2015-06-10 MED ORDER — ASPIRIN EC 81 MG PO TBEC
81.0000 mg | DELAYED_RELEASE_TABLET | Freq: Every day | ORAL | Status: DC
  Administered 2015-06-10 – 2015-06-12 (×3): 81 mg via ORAL
  Filled 2015-06-10 (×3): qty 1

## 2015-06-10 MED ORDER — ATORVASTATIN CALCIUM 40 MG PO TABS
40.0000 mg | ORAL_TABLET | Freq: Every day | ORAL | Status: DC
  Filled 2015-06-10: qty 1

## 2015-06-10 NOTE — Progress Notes (Signed)
  Echocardiogram 2D Echocardiogram has been performed.  Delcie RochENNINGTON, Hideko Esselman 06/10/2015, 11:27 AM

## 2015-06-10 NOTE — Progress Notes (Signed)
OT Cancellation Note  Patient Details Name: Lorina RabonRene Rivenbark MRN: [id number] DOB: 11/15/1960   Cancelled Treatment:    Reason Eval/Treat Not Completed: OT screened, no needs identified, will sign off. Spoke with PT and pt appears to functioning at independent level with no balance, coordination, or visual deficits. Pt has no ADL/IADL concerns at this time. Please re-consult if needs or situation changes.  Nils PyleJulia Ardith Test, OTR/L Pager: [phone] 06/10/2015, 10:07 AM

## 2015-06-10 NOTE — Progress Notes (Signed)
PT NOTE:  Noted new order for PT. Pt was evaluated by PT this AM and no deficits noted and PT signed off.  If there is a significant change in status please reorder, otherwise there are no PT needs for the pt at this time.   [address]Mark CalabasasSawulski, South CarolinaPT  [phone] 06/10/2015

## 2015-06-10 NOTE — Progress Notes (Signed)
[facility] PROGRESS NOTE  Moana Munford ZOX:[id number] DOB: 09/11/1960 DOA: 06/09/2015 PCP: No primary care provider on file.  Assessment/Plan: Principal Problem:   Stroke-like symptoms - currently undergoing work up - neurology on board - May have been secondary to TIA - pt currently on aspirin  Active Problems:   Hypertensive emergency - Will continue current antihypertensive regimen   Code Status: Full Family Communication: Discussed directly with patient Disposition Plan: Pending improvement in condition   Consultants:  Neurology  Procedures:  None  Antibiotics:  None  HPI/Subjective: Pt has no new complaints. No acute issues overnight.  Objective: Filed Vitals:   06/10/15 0925 06/10/15 1353  BP: 158/94 141/90  Pulse: 69 61  Temp: 98.2 F (36.8 C) 98.2 F (36.8 C)  Resp: 18 18   No intake or output data in the 24 hours ending 06/10/15 1528 Filed Weights   06/10/15 0533  Weight: 89.5 kg (197 lb 5 oz)    Exam:   General:  Pt in nad, alert and awake  Cardiovascular: rrr, no mrg  Respiratory: cta bl, no wheezes  Abdomen: soft, nt, nd  Musculoskeletal: no cyanosis or clubbing   Data Reviewed: Basic Metabolic Panel:  Recent Labs Lab 06/09/15 1248 06/09/15 1259 06/09/15 1556  NA 141 142  --   K 3.9 4.0  --   CL 107 106  --   CO2 24  --   --   GLUCOSE 108* 108*  --   BUN 17 18  --   CREATININE 1.03* 1.00 0.94  CALCIUM 9.5  --   --    Liver Function Tests:  Recent Labs Lab 06/09/15 1248  AST 19  22  ALT 22  24  ALKPHOS 70  72  BILITOT 0.5  0.6  PROT 7.4  8.0  ALBUMIN 3.9  4.1   No results for input(s): LIPASE, AMYLASE in the last 168 hours.  Recent Labs Lab 06/09/15 1248  AMMONIA 32   CBC:  Recent Labs Lab 06/09/15 1248 06/09/15 1259 06/09/15 1556  WBC 4.3  --  4.8  NEUTROABS 1.8  --   --   HGB 11.9* 12.9 12.2  HCT 37.3 38.0 37.0  MCV 89.7  --  89.2  PLT 182  --  177   Cardiac Enzymes:  Recent  Labs Lab 06/09/15 1248  CKTOTAL 169   BNP (last 3 results) No results for input(s): BNP in the last 8760 hours.  ProBNP (last 3 results) No results for input(s): PROBNP in the last 8760 hours.  CBG: No results for input(s): GLUCAP in the last 168 hours.  No results found for this or any previous visit (from the past 240 hour(s)).   Studies: Dg Chest 2 View  06/09/2015  CLINICAL DATA:  Possible TIA this morning, eyes rolling into back of head, LEFT face and arm numbness, cough EXAM: CHEST  2 VIEW COMPARISON:  None FINDINGS: Upper normal heart size. Mediastinal contours and pulmonary vascularity normal. Atherosclerotic calcification aorta. Faint density projecting over LEFT upper lobe likely an EKG lead. No definite infiltrate, pleural effusion or pneumothorax. Bones unremarkable. IMPRESSION: No acute abnormalities. Electronically Signed   By: Ulyses Southward M.D.   On: 06/09/2015 14:31   Ct Head Wo Contrast  06/09/2015  CLINICAL DATA:  Left arm weakness and left-sided facial tingling beginning approximately at 3:30 this morning. Severe hypertension noted at the time of presentation. EXAM: CT HEAD WITHOUT CONTRAST TECHNIQUE: Contiguous axial images were obtained from the base of the skull  through the vertex without intravenous contrast. COMPARISON:  None. FINDINGS: Examination is degraded secondary to patient motion artifact necessitating the acquisition of additional images. Gray-white differentiation is maintained. No CT evidence of acute large territory infarct. No intraparenchymal or extra-axial mass or hemorrhage. Normal size and configuration of the ventricles and basilar cisterns. No midline shift. There is underpneumatization of the bilateral frontal sinuses. The remaining paranasal sinuses and mastoid air cells are normally aerated. No air-fluid levels. Regional soft tissues appear normal. No displaced calvarial fracture. IMPRESSION: Negative noncontrast head CT. Electronically Signed   By:  Simonne Come M.D.   On: 06/09/2015 12:35   Mr Maxine Glenn Head Wo Contrast  06/09/2015  CLINICAL DATA:  Left arm numbness and left neck pain beginning 6 hours ago. Some dizziness. EXAM: MR HEAD WITHOUT CONTRAST MR CIRCLE OF WILLIS WITHOUT CONTRAST MRA OF THE NECK WITHOUT AND WITH CONTRAST TECHNIQUE: Multiplanar, multiecho pulse sequences of the brain and surrounding structures were obtained according to standard protocol without intravenous contrast.; Multiplanar and multiecho pulse sequences of the neck were obtained without and with intravenous contrast. Angiographic images of the neck were obtained using MRA technique without and with intravenous contast.; Angiographic images of the Circle of Willis were obtained using MRA technique without intravenous contrast. CONTRAST:  18mL MULTIHANCE GADOBENATE DIMEGLUMINE 529 MG/ML IV SOLN COMPARISON:  CT same day FINDINGS: MR HEAD FINDINGS Diffusion imaging does not show any acute or subacute infarction. There are mild chronic appearing small vessel changes of the brainstem, thalami and hemispheric white matter. Punctate foci of hemosiderin are present in the region of those infarctions. No cortical or large vessel territory infarction. No mass lesion, acute hemorrhage, hydrocephalus or extra-axial collection. No pituitary mass. No inflammatory sinus disease. MR CIRCLE OF WILLIS FINDINGS Both internal carotid arteries are widely patent into the brain. No siphon stenosis. The anterior and middle cerebral vessels are normal without proximal stenosis, aneurysm or vascular malformation. Both vertebral arteries are normal with the left being dominant. No basilar stenosis. Posterior circulation branch vessels are normal. Right PCA takes a fetal origin from the anterior circulation. MRA NECK FINDINGS Branching pattern of the brachiocephalic vessels from the arch is normal. No origin stenosis. Both common carotid arteries are widely patent to their respective bifurcation. No carotid  bifurcation narrowing or irregularity. Cervical internal carotid arteries are normal. Both vertebral arteries are normal without origin stenosis. No narrowing or irregularity. IMPRESSION: No acute brain finding. Chronic small-vessel ischemic changes of a mild degree. Punctate hemosiderin deposition associated with many of the old small vessel insults. No sign of acute hemorrhage. Normal intracranial MR angiography of the large and medium size vessels. Normal MR angiography of the neck vessels. Electronically Signed   By: Paulina Fusi M.D.   On: 06/09/2015 14:32   Mr Angiogram Neck W Wo Contrast  06/09/2015  CLINICAL DATA:  Left arm numbness and left neck pain beginning 6 hours ago. Some dizziness. EXAM: MR HEAD WITHOUT CONTRAST MR CIRCLE OF WILLIS WITHOUT CONTRAST MRA OF THE NECK WITHOUT AND WITH CONTRAST TECHNIQUE: Multiplanar, multiecho pulse sequences of the brain and surrounding structures were obtained according to standard protocol without intravenous contrast.; Multiplanar and multiecho pulse sequences of the neck were obtained without and with intravenous contrast. Angiographic images of the neck were obtained using MRA technique without and with intravenous contast.; Angiographic images of the Circle of Willis were obtained using MRA technique without intravenous contrast. CONTRAST:  18mL MULTIHANCE GADOBENATE DIMEGLUMINE 529 MG/ML IV SOLN COMPARISON:  CT same day  FINDINGS: MR HEAD FINDINGS Diffusion imaging does not show any acute or subacute infarction. There are mild chronic appearing small vessel changes of the brainstem, thalami and hemispheric white matter. Punctate foci of hemosiderin are present in the region of those infarctions. No cortical or large vessel territory infarction. No mass lesion, acute hemorrhage, hydrocephalus or extra-axial collection. No pituitary mass. No inflammatory sinus disease. MR CIRCLE OF WILLIS FINDINGS Both internal carotid arteries are widely patent into the brain. No  siphon stenosis. The anterior and middle cerebral vessels are normal without proximal stenosis, aneurysm or vascular malformation. Both vertebral arteries are normal with the left being dominant. No basilar stenosis. Posterior circulation branch vessels are normal. Right PCA takes a fetal origin from the anterior circulation. MRA NECK FINDINGS Branching pattern of the brachiocephalic vessels from the arch is normal. No origin stenosis. Both common carotid arteries are widely patent to their respective bifurcation. No carotid bifurcation narrowing or irregularity. Cervical internal carotid arteries are normal. Both vertebral arteries are normal without origin stenosis. No narrowing or irregularity. IMPRESSION: No acute brain finding. Chronic small-vessel ischemic changes of a mild degree. Punctate hemosiderin deposition associated with many of the old small vessel insults. No sign of acute hemorrhage. Normal intracranial MR angiography of the large and medium size vessels. Normal MR angiography of the neck vessels. Electronically Signed   By: Paulina FusiMark  Shogry M.D.   On: 06/09/2015 14:32   Mr Brain Wo Contrast  06/09/2015  CLINICAL DATA:  Left arm numbness and left neck pain beginning 6 hours ago. Some dizziness. EXAM: MR HEAD WITHOUT CONTRAST MR CIRCLE OF WILLIS WITHOUT CONTRAST MRA OF THE NECK WITHOUT AND WITH CONTRAST TECHNIQUE: Multiplanar, multiecho pulse sequences of the brain and surrounding structures were obtained according to standard protocol without intravenous contrast.; Multiplanar and multiecho pulse sequences of the neck were obtained without and with intravenous contrast. Angiographic images of the neck were obtained using MRA technique without and with intravenous contast.; Angiographic images of the Circle of Willis were obtained using MRA technique without intravenous contrast. CONTRAST:  18mL MULTIHANCE GADOBENATE DIMEGLUMINE 529 MG/ML IV SOLN COMPARISON:  CT same day FINDINGS: MR HEAD FINDINGS  Diffusion imaging does not show any acute or subacute infarction. There are mild chronic appearing small vessel changes of the brainstem, thalami and hemispheric white matter. Punctate foci of hemosiderin are present in the region of those infarctions. No cortical or large vessel territory infarction. No mass lesion, acute hemorrhage, hydrocephalus or extra-axial collection. No pituitary mass. No inflammatory sinus disease. MR CIRCLE OF WILLIS FINDINGS Both internal carotid arteries are widely patent into the brain. No siphon stenosis. The anterior and middle cerebral vessels are normal without proximal stenosis, aneurysm or vascular malformation. Both vertebral arteries are normal with the left being dominant. No basilar stenosis. Posterior circulation branch vessels are normal. Right PCA takes a fetal origin from the anterior circulation. MRA NECK FINDINGS Branching pattern of the brachiocephalic vessels from the arch is normal. No origin stenosis. Both common carotid arteries are widely patent to their respective bifurcation. No carotid bifurcation narrowing or irregularity. Cervical internal carotid arteries are normal. Both vertebral arteries are normal without origin stenosis. No narrowing or irregularity. IMPRESSION: No acute brain finding. Chronic small-vessel ischemic changes of a mild degree. Punctate hemosiderin deposition associated with many of the old small vessel insults. No sign of acute hemorrhage. Normal intracranial MR angiography of the large and medium size vessels. Normal MR angiography of the neck vessels. Electronically Signed   By: Loraine LericheMark  Shogry M.D.   On: 06/09/2015 14:32    Scheduled Meds: . aspirin EC  81 mg Oral Daily  . atorvastatin  40 mg Oral q1800  . enoxaparin (LOVENOX) injection  40 mg Subcutaneous Q24H  . labetalol  100 mg Oral BID  . ondansetron (ZOFRAN) IV  4 mg Intravenous Once   Continuous Infusions:   Time spent: > 35 minutes  Penny Pia  [facility] Pager [phone] If 7PM-7AM, please contact night-coverage at [email or web address], password [facility] 06/10/2015, 3:28 PM
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
## 2015-06-10 NOTE — Evaluation (Signed)
Physical Therapy Evaluation Patient Details Name: Dorothy Gilbert MRN: [id number] DOB: Aug 04, 1960 Today's Date: 06/10/2015   History of Present Illness  Admitted with left sided weakness which has now resolved. PMH: HTN  Clinical Impression  Pt presents to PT with no residual weakness and no balance deficits. No need for further PT.  Thanks for the consult.      Follow Up Recommendations No PT follow up    Equipment Recommendations  None recommended by PT    Recommendations for Other Services       Precautions / Restrictions Precautions Precautions: None      Mobility  Bed Mobility Overal bed mobility: Independent                Transfers Overall transfer level: Independent Equipment used: None                Ambulation/Gait Ambulation/Gait assistance: Independent Ambulation Distance (Feet): 200 Feet Assistive device: None Gait Pattern/deviations: Step-through pattern   Gait velocity interpretation: at or above normal speed for age/gender    Stairs            Wheelchair Mobility    Modified Rankin (Stroke Patients Only) Modified Rankin (Stroke Patients Only) Pre-Morbid Rankin Score: No symptoms Modified Rankin: No symptoms     Balance Overall balance assessment: Independent                               Standardized Balance Assessment Standardized Balance Assessment : Dynamic Gait Index   Dynamic Gait Index Level Surface: Normal Change in Gait Speed: Normal Gait with Horizontal Head Turns: Normal Gait with Vertical Head Turns: Normal Gait and Pivot Turn: Normal Step Over Obstacle: Normal Step Around Obstacles: Normal Steps: Normal (Simulated) Total Score: 24       Pertinent Vitals/Pain Pain Assessment: No/denies pain    Home Living Family/patient expects to be discharged to:: Private residence Living Arrangements: Non-relatives/Friends Available Help at Discharge: Friend(s) Type of Home: House               Prior Function Level of Independence: Independent               Hand Dominance   Dominant Hand: Left    Extremity/Trunk Assessment   Upper Extremity Assessment: Overall WFL for tasks assessed           Lower Extremity Assessment: Overall WFL for tasks assessed      Cervical / Trunk Assessment: Normal  Communication   Communication: No difficulties  Cognition Arousal/Alertness: Awake/alert Behavior During Therapy: WFL for tasks assessed/performed Overall Cognitive Status: Within Functional Limits for tasks assessed                      General Comments General comments (skin integrity, edema, etc.): No family/visitors present. Pt c/o being tired from being up most of the night but no c/o focal weakness.Pt able to write as normal with her left hand and coordination WNL.    Exercises        Assessment/Plan    PT Assessment Patent does not need any further PT services  PT Diagnosis Abnormality of gait   PT Problem List    PT Treatment Interventions     PT Goals (Current goals can be found in the Care Plan section) Acute Rehab PT Goals Patient Stated Goal: to go home today    Frequency     Barriers to discharge  Co-evaluation               End of Session Equipment Utilized During Treatment: Gait belt Activity Tolerance: Patient tolerated treatment well Patient left: in bed;with call bell/phone within reach Nurse Communication: Mobility status    Functional Assessment Tool Used: clinical judgement and DGI Functional Limitation: Mobility: Walking and moving around Mobility: Walking and Moving Around Current Status [phone](G8978): 0 percent impaired, limited or restricted Mobility: Walking and Moving Around Goal Status [phone](G8979): 0 percent impaired, limited or restricted Mobility: Walking and Moving Around Discharge Status [phone](G8980): 0 percent impaired, limited or restricted    Time: [id number] PT Time Calculation (min) (ACUTE ONLY): 19  min   Charges:   PT Evaluation $PT Eval Low Complexity: 1 Procedure     PT G Codes:   PT G-Codes **NOT FOR INPATIENT CLASS** Functional Assessment Tool Used: clinical judgement and DGI Functional Limitation: Mobility: Walking and moving around Mobility: Walking and Moving Around Current Status ([id number](G8978): 0 percent impaired, limited or restricted Mobility: Walking and Moving Around Goal Status ([id number](G8979): 0 percent impaired, limited or restricted Mobility: Walking and Moving Around Discharge Status [phone](G8980): 0 percent impaired, limited or restricted    Donnella ShamSawulski, Cindi Ghazarian J 06/10/2015, 10:04 AM  Lavona MoundMark Rossie Scarfone, PT  [phone] 06/10/2015

## 2015-06-11 ENCOUNTER — Observation Stay (HOSPITAL_COMMUNITY): Payer: MEDICAID

## 2015-06-11 ENCOUNTER — Observation Stay (HOSPITAL_BASED_OUTPATIENT_CLINIC_OR_DEPARTMENT_OTHER): Payer: MEDICAID

## 2015-06-11 DIAGNOSIS — R2990 Unspecified symptoms and signs involving the nervous system: Secondary | ICD-10-CM

## 2015-06-11 DIAGNOSIS — Q211 Atrial septal defect: Secondary | ICD-10-CM

## 2015-06-11 DIAGNOSIS — G459 Transient cerebral ischemic attack, unspecified: Secondary | ICD-10-CM

## 2015-06-11 DIAGNOSIS — R42 Dizziness and giddiness: Secondary | ICD-10-CM

## 2015-06-11 LAB — HEMOGLOBIN A1C
Hgb A1c MFr Bld: 6.1 % — ABNORMAL HIGH (ref 4.8–5.6)
Mean Plasma Glucose: 128 mg/dL

## 2015-06-11 NOTE — Progress Notes (Signed)
STROKE TEAM PROGRESS NOTE   HISTORY OF PRESENT ILLNESS (at time of admission) Dorothy Gilbert is an 55 y.o. female patient who presented to the emergency room with symptoms of left face, upper and lower extremities weakness and numbness symptoms. She is not sure what time the symptoms started. She did not feel well when she woke up this morning and had some dizziness and numbness in her face. She woke up around 3:30 AM to go to bathroom and she felt normal at that time. The symptoms got worse roughly around 9 AM. Her friend checked her blood pressure at home which was highly elevated to 190 systolic. She also has some dizziness and presyncopal type symptoms. She has known history of hypertension but does not take any medications, instead she is on several supplemental and alternative medical therapies. Denies any vision or speech problems. Her symptoms are mostly resolved at the time of my evaluation.   Date last known well: 06/09/15 Time last known well: Possibly around 3.30 am tPA Given: No: Non focal exam, unknown clear onset time, woke up with symptoms.    SUBJECTIVE (INTERVAL HISTORY) Her family is not at the bedside.  Overall she feels her condition is completely resolved.  She has had prior history of anxiety and panic episodes. She states she had a bout of coughing out thick mucus and may have passed out and this happened more than once   OBJECTIVE Temp:  [98.1 F (36.7 C)-98.5 F (36.9 C)] 98.1 F (36.7 C) (03/06 1400) Pulse Rate:  [60-66] 62 (03/06 1400) Cardiac Rhythm:  [-] Normal sinus rhythm (03/06 0700) Resp:  [18] 18 (03/06 1400) BP: (131-177)/(83-99) 155/93 mmHg (03/06 1400) SpO2:  [94 %-100 %] 100 % (03/06 1400)  CBC:   Recent Labs Lab 06/09/15 1248 06/09/15 1259 06/09/15 1556  WBC 4.3  --  4.8  NEUTROABS 1.8  --   --   HGB 11.9* 12.9 12.2  HCT 37.3 38.0 37.0  MCV 89.7  --  89.2  PLT 182  --  177    Basic Metabolic Panel:   Recent Labs Lab 06/09/15 1248  06/09/15 1259 06/09/15 1556  NA 141 142  --   K 3.9 4.0  --   CL 107 106  --   CO2 24  --   --   GLUCOSE 108* 108*  --   BUN 17 18  --   CREATININE 1.03* 1.00 0.94  CALCIUM 9.5  --   --     Lipid Panel:     Component Value Date/Time   CHOL 195 06/10/2015 0212   TRIG 93 06/10/2015 0212   HDL 55 06/10/2015 0212   CHOLHDL 3.5 06/10/2015 0212   VLDL 19 06/10/2015 0212   LDLCALC 121* 06/10/2015 0212   HgbA1c:  Lab Results  Component Value Date   HGBA1C 6.1* 06/10/2015   Urine Drug Screen:     Component Value Date/Time   LABOPIA NONE DETECTED 06/09/2015 1215   COCAINSCRNUR NONE DETECTED 06/09/2015 1215   LABBENZ NONE DETECTED 06/09/2015 1215   AMPHETMU NONE DETECTED 06/09/2015 1215   THCU NONE DETECTED 06/09/2015 1215   LABBARB NONE DETECTED 06/09/2015 1215      IMAGING  Dg Chest 2 View 06/09/2015   No acute abnormalities.   Ct Head Wo Contrast  06/09/2015   Negative noncontrast head CT.   Mr [facility] and Neck Wo Contrast 06/09/2015   No acute brain finding. Chronic small-vessel ischemic changes of a mild degree. Punctate hemosiderin  deposition associated with many of the old small vessel insults. No sign of acute hemorrhage. Normal intracranial MR angiography of the large and medium size vessels. Normal MR angiography of the neck vessels.    PHYSICAL EXAM General - Well nourished, well developed, in NAD   Cardiovascular - Regular rate and rhythm Pulmonary: CTA Abdomen: NT, ND, normal bowel sounds Extremities: No C/C/E  Neurological Exam Mental Status: Normal Orientation:  Oriented to person, place and time Speech:  Fluent; no dysarthria Cranial Nerves:  PERRL; EOMI; visual fields full, face grossly symmetric, hearing grossly intact; shrug symmetric and tongue midline Motor Exam:  Tone:  Within normal limits; Strength: 5/5 throughout Sensory: Intact to light touch throughout Coordination:  Intact finger to nose Gait: Deferred   ASSESSMENT/PLAN Dorothy Gilbert is a 55 y.o. female with history of hypertension treated with over-the-counter herbal medications and supplements presenting with left hemiparesis and sensory deficits.  She did not receive IV t-PA due to resolution of deficits.  Possible TIA:  Non-dominant   Resultant  Transient left side numbness and loss of conciousness  MRI  negative for acute findings.  MRA  normal   Carotid Doppler pending  2D Echo - EF 60-65%. A PFO could not be excluded.   Check lower extremity Dopplers for DVT  LDL - 121  HgbA1c pending  VTE prophylaxis - Lovenox Diet 2 gram sodium Room service appropriate?: Yes; Fluid consistency:: Thin  No antithrombotic prior to admission, now on No antithrombotic (aspirin 81 mg daily recommended - will order)  Patient counseled to be compliant with her antithrombotic medications  Ongoing aggressive stroke risk factor management  Therapy recommendations:  No need to identified  Disposition:  Pending  Hypertension  Blood pressure somewhat high at times - consider medical therapy. Permissive hypertension (OK if < 220/120) but gradually normalize in 5-7 days  Hyperlipidemia  Home meds: No lipid-lowering medications prior to admission  LDL 121, goal < 70  Add Lipitor 40 mg daily  Continue statin at discharge  Other Stroke Risk Factors  Obesity, Body mass index is 31.86 kg/(m^2).   Other Active Problems  Possible PFO - Check LE dopplers.  Hospital day #   I have personally examined this patient, reviewed notes, independently viewed imaging studies, participated in medical decision making and plan of care. I have made any additions or clarifications directly to the above note. Agree with note above.  The patient's episode appears little bit bizarre and unlikely to represent a TIA or stroke possibly underlying anxiety/panic may have contributed. No further stroke workup is necessary. Stroke team will sign off.  Delia HeadyPramod Jessalyn Hinojosa, MD Medical  Director [facility] Pager: [phone] 06/11/2015 4:28 PM               To contact Stroke Continuity provider, please refer to [email or web address]. After hours, contact General Neurology

## 2015-06-11 NOTE — Progress Notes (Signed)
[facility] PROGRESS NOTE  Arnelle Nale ZOX:[id number] DOB: [birth year] DOA: 06/09/2015 PCP: No primary care provider on file.  Assessment/Plan: Principal Problem:   Stroke-like symptoms - currently undergoing work up: awaiting EEG - neurology on board - Etiology still uncertain  Active Problems:   Hypertensive emergency - Pt has had fluctuation in blood pressures last blood pressure 131/84. On Labetalol started recently. Will await after today to see whether or not to increase dose as recommended on epocrates.   Code Status: Full Family Communication: Discussed directly with patient Disposition Plan: Pending improvement in condition   Consultants:  Neurology  Procedures:  None  Antibiotics:  None  HPI/Subjective: Pt worried about current recommended work up.  I have answered her questions to her satisfaction  Objective: Filed Vitals:   06/11/15 0630 06/11/15 0954  BP: 135/83 131/84  Pulse: 64 60  Temp: 98.1 F (36.7 C) 98.2 F (36.8 C)  Resp: 18 18   No intake or output data in the 24 hours ending 06/11/15 1139 Filed Weights   06/10/15 0533  Weight: 89.5 kg (197 lb 5 oz)    Exam:   General:  Pt in nad, alert and awake  Cardiovascular: rrr, no mrg  Respiratory: cta bl, no wheezes  Abdomen: soft, nt, nd  Musculoskeletal: no cyanosis or clubbing   Data Reviewed: Basic Metabolic Panel:  Recent Labs Lab 06/09/15 1248 06/09/15 1259 06/09/15 1556  NA 141 142  --   K 3.9 4.0  --   CL 107 106  --   CO2 24  --   --   GLUCOSE 108* 108*  --   BUN 17 18  --   CREATININE 1.03* 1.00 0.94  CALCIUM 9.5  --   --    Liver Function Tests:  Recent Labs Lab 06/09/15 1248  AST 19  22  ALT 22  24  ALKPHOS 70  72  BILITOT 0.5  0.6  PROT 7.4  8.0  ALBUMIN 3.9  4.1   No results for input(s): LIPASE, AMYLASE in the last 168 hours.  Recent Labs Lab 06/09/15 1248  AMMONIA 32   CBC:  Recent Labs Lab 06/09/15 1248 06/09/15 1259  06/09/15 1556  WBC 4.3  --  4.8  NEUTROABS 1.8  --   --   HGB 11.9* 12.9 12.2  HCT 37.3 38.0 37.0  MCV 89.7  --  89.2  PLT 182  --  177   Cardiac Enzymes:  Recent Labs Lab 06/09/15 1248  CKTOTAL 169   BNP (last 3 results) No results for input(s): BNP in the last 8760 hours.  ProBNP (last 3 results) No results for input(s): PROBNP in the last 8760 hours.  CBG: No results for input(s): GLUCAP in the last 168 hours.  No results found for this or any previous visit (from the past 240 hour(s)).   Studies: Dg Chest 2 View  06/09/2015  CLINICAL DATA:  Possible TIA this morning, eyes rolling into back of head, LEFT face and arm numbness, cough EXAM: CHEST  2 VIEW COMPARISON:  None FINDINGS: Upper normal heart size. Mediastinal contours and pulmonary vascularity normal. Atherosclerotic calcification aorta. Faint density projecting over LEFT upper lobe likely an EKG lead. No definite infiltrate, pleural effusion or pneumothorax. Bones unremarkable. IMPRESSION: No acute abnormalities. Electronically Signed   By: Ulyses Southward M.D.   On: 06/09/2015 14:31   Ct Head Wo Contrast  06/09/2015  CLINICAL DATA:  Left arm weakness and left-sided facial tingling beginning approximately at 3:30  this morning. Severe hypertension noted at the time of presentation. EXAM: CT HEAD WITHOUT CONTRAST TECHNIQUE: Contiguous axial images were obtained from the base of the skull through the vertex without intravenous contrast. COMPARISON:  None. FINDINGS: Examination is degraded secondary to patient motion artifact necessitating the acquisition of additional images. Gray-white differentiation is maintained. No CT evidence of acute large territory infarct. No intraparenchymal or extra-axial mass or hemorrhage. Normal size and configuration of the ventricles and basilar cisterns. No midline shift. There is underpneumatization of the bilateral frontal sinuses. The remaining paranasal sinuses and mastoid air cells are normally  aerated. No air-fluid levels. Regional soft tissues appear normal. No displaced calvarial fracture. IMPRESSION: Negative noncontrast head CT. Electronically Signed   By: Simonne ComeJohn  Watts M.D.   On: 06/09/2015 12:35   Mr Maxine GlennMra Head Wo Contrast  06/09/2015  CLINICAL DATA:  Left arm numbness and left neck pain beginning 6 hours ago. Some dizziness. EXAM: MR HEAD WITHOUT CONTRAST MR CIRCLE OF WILLIS WITHOUT CONTRAST MRA OF THE NECK WITHOUT AND WITH CONTRAST TECHNIQUE: Multiplanar, multiecho pulse sequences of the brain and surrounding structures were obtained according to standard protocol without intravenous contrast.; Multiplanar and multiecho pulse sequences of the neck were obtained without and with intravenous contrast. Angiographic images of the neck were obtained using MRA technique without and with intravenous contast.; Angiographic images of the Circle of Willis were obtained using MRA technique without intravenous contrast. CONTRAST:  18mL MULTIHANCE GADOBENATE DIMEGLUMINE 529 MG/ML IV SOLN COMPARISON:  CT same day FINDINGS: MR HEAD FINDINGS Diffusion imaging does not show any acute or subacute infarction. There are mild chronic appearing small vessel changes of the brainstem, thalami and hemispheric white matter. Punctate foci of hemosiderin are present in the region of those infarctions. No cortical or large vessel territory infarction. No mass lesion, acute hemorrhage, hydrocephalus or extra-axial collection. No pituitary mass. No inflammatory sinus disease. MR CIRCLE OF WILLIS FINDINGS Both internal carotid arteries are widely patent into the brain. No siphon stenosis. The anterior and middle cerebral vessels are normal without proximal stenosis, aneurysm or vascular malformation. Both vertebral arteries are normal with the left being dominant. No basilar stenosis. Posterior circulation branch vessels are normal. Right PCA takes a fetal origin from the anterior circulation. MRA NECK FINDINGS Branching pattern  of the brachiocephalic vessels from the arch is normal. No origin stenosis. Both common carotid arteries are widely patent to their respective bifurcation. No carotid bifurcation narrowing or irregularity. Cervical internal carotid arteries are normal. Both vertebral arteries are normal without origin stenosis. No narrowing or irregularity. IMPRESSION: No acute brain finding. Chronic small-vessel ischemic changes of a mild degree. Punctate hemosiderin deposition associated with many of the old small vessel insults. No sign of acute hemorrhage. Normal intracranial MR angiography of the large and medium size vessels. Normal MR angiography of the neck vessels. Electronically Signed   By: Paulina FusiMark  Shogry M.D.   On: 06/09/2015 14:32   Mr Angiogram Neck W Wo Contrast  06/09/2015  CLINICAL DATA:  Left arm numbness and left neck pain beginning 6 hours ago. Some dizziness. EXAM: MR HEAD WITHOUT CONTRAST MR CIRCLE OF WILLIS WITHOUT CONTRAST MRA OF THE NECK WITHOUT AND WITH CONTRAST TECHNIQUE: Multiplanar, multiecho pulse sequences of the brain and surrounding structures were obtained according to standard protocol without intravenous contrast.; Multiplanar and multiecho pulse sequences of the neck were obtained without and with intravenous contrast. Angiographic images of the neck were obtained using MRA technique without and with intravenous contast.; Angiographic images of  the Circle of Willis were obtained using MRA technique without intravenous contrast. CONTRAST:  18mL MULTIHANCE GADOBENATE DIMEGLUMINE 529 MG/ML IV SOLN COMPARISON:  CT same day FINDINGS: MR HEAD FINDINGS Diffusion imaging does not show any acute or subacute infarction. There are mild chronic appearing small vessel changes of the brainstem, thalami and hemispheric white matter. Punctate foci of hemosiderin are present in the region of those infarctions. No cortical or large vessel territory infarction. No mass lesion, acute hemorrhage, hydrocephalus or  extra-axial collection. No pituitary mass. No inflammatory sinus disease. MR CIRCLE OF WILLIS FINDINGS Both internal carotid arteries are widely patent into the brain. No siphon stenosis. The anterior and middle cerebral vessels are normal without proximal stenosis, aneurysm or vascular malformation. Both vertebral arteries are normal with the left being dominant. No basilar stenosis. Posterior circulation branch vessels are normal. Right PCA takes a fetal origin from the anterior circulation. MRA NECK FINDINGS Branching pattern of the brachiocephalic vessels from the arch is normal. No origin stenosis. Both common carotid arteries are widely patent to their respective bifurcation. No carotid bifurcation narrowing or irregularity. Cervical internal carotid arteries are normal. Both vertebral arteries are normal without origin stenosis. No narrowing or irregularity. IMPRESSION: No acute brain finding. Chronic small-vessel ischemic changes of a mild degree. Punctate hemosiderin deposition associated with many of the old small vessel insults. No sign of acute hemorrhage. Normal intracranial MR angiography of the large and medium size vessels. Normal MR angiography of the neck vessels. Electronically Signed   By: Paulina Fusi M.D.   On: 06/09/2015 14:32   Mr Brain Wo Contrast  06/09/2015  CLINICAL DATA:  Left arm numbness and left neck pain beginning 6 hours ago. Some dizziness. EXAM: MR HEAD WITHOUT CONTRAST MR CIRCLE OF WILLIS WITHOUT CONTRAST MRA OF THE NECK WITHOUT AND WITH CONTRAST TECHNIQUE: Multiplanar, multiecho pulse sequences of the brain and surrounding structures were obtained according to standard protocol without intravenous contrast.; Multiplanar and multiecho pulse sequences of the neck were obtained without and with intravenous contrast. Angiographic images of the neck were obtained using MRA technique without and with intravenous contast.; Angiographic images of the Circle of Willis were obtained  using MRA technique without intravenous contrast. CONTRAST:  18mL MULTIHANCE GADOBENATE DIMEGLUMINE 529 MG/ML IV SOLN COMPARISON:  CT same day FINDINGS: MR HEAD FINDINGS Diffusion imaging does not show any acute or subacute infarction. There are mild chronic appearing small vessel changes of the brainstem, thalami and hemispheric white matter. Punctate foci of hemosiderin are present in the region of those infarctions. No cortical or large vessel territory infarction. No mass lesion, acute hemorrhage, hydrocephalus or extra-axial collection. No pituitary mass. No inflammatory sinus disease. MR CIRCLE OF WILLIS FINDINGS Both internal carotid arteries are widely patent into the brain. No siphon stenosis. The anterior and middle cerebral vessels are normal without proximal stenosis, aneurysm or vascular malformation. Both vertebral arteries are normal with the left being dominant. No basilar stenosis. Posterior circulation branch vessels are normal. Right PCA takes a fetal origin from the anterior circulation. MRA NECK FINDINGS Branching pattern of the brachiocephalic vessels from the arch is normal. No origin stenosis. Both common carotid arteries are widely patent to their respective bifurcation. No carotid bifurcation narrowing or irregularity. Cervical internal carotid arteries are normal. Both vertebral arteries are normal without origin stenosis. No narrowing or irregularity. IMPRESSION: No acute brain finding. Chronic small-vessel ischemic changes of a mild degree. Punctate hemosiderin deposition associated with many of the old small vessel insults. No sign  of acute hemorrhage. Normal intracranial MR angiography of the large and medium size vessels. Normal MR angiography of the neck vessels. Electronically Signed   By: Paulina Fusi M.D.   On: 06/09/2015 14:32    Scheduled Meds: . aspirin EC  81 mg Oral Daily  . atorvastatin  40 mg Oral q1800  . enoxaparin (LOVENOX) injection  40 mg Subcutaneous Q24H  .  labetalol  100 mg Oral BID  . ondansetron (ZOFRAN) IV  4 mg Intravenous Once   Continuous Infusions:   Time spent: > 35 minutes  Penny Pia  [facility] Pager [phone] If 7PM-7AM, please contact night-coverage at [email or web address], password [facility] 06/11/2015, 11:39 AM

## 2015-06-11 NOTE — Progress Notes (Signed)
EEG Completed; Results Pending. Not a sleep deprived study.

## 2015-06-11 NOTE — Progress Notes (Signed)
Patient refuses Sleep Deprived EEG & statin med.  States she is willing to have Carotid dopplers. Will inform MD.

## 2015-06-11 NOTE — Care Management Note (Signed)
Case Management Note  Patient Details  Name: Dorothy Gilbert MRN: [id number] Date of Birth: 10/27/1960  Subjective/Objective:    Patient admitted with stroke like symptoms. MRI negative.  [place].                 Action/Plan: No follow up per PT. CM continuing to follow for discharge needs.   Expected Discharge Date:                  Expected Discharge Plan:  Home/Self Care  In-House Referral:     Discharge planning Services     Post Acute Care Choice:    Choice offered to:     DME Arranged:    DME Agency:     HH Arranged:    HH Agency:     Status of Service:  In process, will continue to follow  Medicare Important Message Given:    Date Medicare IM Given:    Medicare IM give by:    Date Additional Medicare IM Given:    Additional Medicare Important Message give by:     If discussed at Long Length of Stay Meetings, dates discussed:    Additional Comments:  [name], RN 06/11/2015, 2:01 PM

## 2015-06-11 NOTE — Progress Notes (Addendum)
*  PRELIMINARY RESULTS* Vascular Ultrasound Carotid Duplex (Doppler) has been completed.  There is no obvious evidence of hemodynamically significant internal carotid artery stenosis bilaterally. Vertebral arteries are patent with antegrade flow.  Bilateral lower extremity venous duplex completed. Bilateral lower extremities are negative for deep vein thrombosis. There is no evidence of Baker's cyst bilaterally.  06/11/2015 3:50 PM Gertie FeyMichelle Raheim Beutler, RVT, RDCS, RDMS

## 2015-06-11 NOTE — Procedures (Signed)
HPI:  55 y.o. female patient who presented to the emergency room with symptoms of left face, upper and lower extremities weakness and numbness symptoms. She is not sure what time the symptoms started. She did not feel well when she woke up this morning and had some dizziness and numbness in her face. She woke up around 3:30 AM to go to bathroom and she felt normal at that time. The symptoms got worse roughly around 9 AM. Her friend checked her blood pressure at home which was highly elevated to 190 systolic. She also has some dizziness and presyncopal type symptoms. She has known history of hypertension but does not take any medications, instead she is on several supplemental and alternative medical therapies.  MEDS:  acetaminophen (TYLENOL) tablet 650 mg aspirin EC tablet 81 mg atorvastatin (LIPITOR) tablet 40 mg enoxaparin (LOVENOX) injection 40 mg hydrALAZINE (APRESOLINE) injection 10 mg labetalol (NORMODYNE) tablet 100 mg ondansetron (ZOFRAN) injection 4 mg senna-docusate (Senokot-S) tablet 1 tablet   TECHNICAL SUMMARY:  A multichannel referential and bipolar montage EEG using the standard international 10-20 system was performed on the patient described as awake, drowsy and asleep.  An excessive amount of beta/fast activity is noted throughout the recording.  Occasional 8.5-9 Hz activity is noted posteriorly.  This is symmetric.    ACTIVATION:  Stepwise photic stimulation at 4-20 flashes per second was performed and did not elicit any abnormal waveforms, but did produce a symmetric driving response.  Hyperventilation was performed for 3 minutes with good patient effort and produced no changes in the background activity.  EPILEPTIFORM ACTIVITY:  There were no spikes, sharp waves or paroxysmal activity.  SLEEP:  Physiologic drowsiness and stage II sleep were noted  CARDIAC:  The EKG lead revealed a regular sinus rhythm.  IMPRESSION:  This EEG demonstrated no focal, hemispheric, or  lateralizing features.  There was no epileptiform activity recorded.  There was an excess amount of fast (beta) activity recorded which is usually due to medications such as benzodiazepines.

## 2015-06-12 MED ORDER — ATORVASTATIN CALCIUM 40 MG PO TABS: 40.0000 mg | ORAL_TABLET | Freq: Every day | ORAL | Status: AC

## 2015-06-12 MED ORDER — ASPIRIN 81 MG PO TBEC: 81.0000 mg | DELAYED_RELEASE_TABLET | Freq: Every day | ORAL | Status: AC

## 2015-06-12 MED ORDER — LABETALOL HCL 100 MG PO TABS: 100.0000 mg | ORAL_TABLET | Freq: Two times a day (BID) | ORAL | Status: DC

## 2015-06-12 NOTE — Care Management Note (Signed)
Case Management Note  Patient Details  Name: Ruthanne Mcneish MRN: [id number] Date of Birth: [birth year]  Subjective/Objective:                    Action/Plan: Patient with no insurance and no PCP. CM met with the patient to see what her plan is in regards to returning to [place]. She states she will be in the area for about 10 day and go back to [place] for a visit and then return to [facility]. CM asked if she was interested in being set up with the [facility]. She was interested. CM called and go her an appointment for March 13th at 10:00. Information given to the patient and placed on the AVS. Patient also instructed on the use of the pharmacy today at discharge to assist in obtaining her meds. Bedside RN updated.   Expected Discharge Date:                  Expected Discharge Plan:  Home/Self Care  In-House Referral:     Discharge planning Services     Post Acute Care Choice:    Choice offered to:     DME Arranged:    DME Agency:     HH Arranged:    HH Agency:     Status of Service:  In process, will continue to follow  Medicare Important Message Given:    Date Medicare IM Given:    Medicare IM give by:    Date Additional Medicare IM Given:    Additional Medicare Important Message give by:     If discussed at [place] of Stay Meetings, dates discussed:    Additional Comments:  Pollie Friar, RN 06/12/2015, 2:34 PM

## 2015-06-12 NOTE — Discharge Summary (Signed)
Physician Discharge Summary  Dorothy Gilbert WUJ:[id number] DOB: 10/31/1960 DOA: 06/09/2015  PCP: No primary care provider on file.  Admit date: 06/09/2015 Discharge date: 06/12/2015  Time spent: > 35 minutes  Recommendations for Outpatient Follow-up:  1. Monitor blood pressures and adjust antihypertensive regimen appropriately  Discharge Diagnoses:  Principal Problem:   Stroke-like symptoms Active Problems:   Hypertensive emergency   TIA (transient ischemic attack)   Discharge Condition: stable  Diet recommendation: Low sodium diet  Filed Weights   06/10/15 0533  Weight: 89.5 kg (197 lb 5 oz)    History of present illness:  55 y.o. female with HTN who takes holistic medications presents with an onset of loss of consciousness, left face arm and leg numbness and weakness. The patient was with her friends and did not feel well early this morning. They checked her blood pressure and noted that systolic was greater than 190  Hospital Course:  Principal Problem:  Stroke-like symptoms - neurology on board while patient was in house - Aspirin - Will provide antihypertensive on d/c - Lipitor will also be recommended on d/c - Etiology still uncertain may have represented a TIA.  Active Problems:  Hypertensive emergency - Pt has had fluctuation in blood pressures last blood pressure 131/84. On Labetalol started recently. Will await after today to see whether or not to increase dose as recommended on epocrates.   Procedures:  MRI negative for acute findings.  MRA normal   Carotid Doppler pending  2D Echo - EF 60-65%. A PFO could not be excluded.   Check lower extremity Dopplers for DVT  LDL - 121  HgbA1c pending  EEG negative  Consultations:  Neurology  Discharge Exam: Filed Vitals:   06/12/15 0057 06/12/15 0509  BP: 125/73 158/91  Pulse:  71  Temp: 97.8 F (36.6 C) 97.7 F (36.5 C)  Resp: 18 18    General: Pt in nad, alert and awake Cardiovascular:  rrr, no mrg Respiratory: cta BL, no wheezes  Discharge Instructions   Discharge Instructions    Call MD for:  difficulty breathing, headache or visual disturbances    Complete by:  As directed      Call MD for:  temperature >100.4    Complete by:  As directed      Diet - low sodium heart healthy    Complete by:  As directed      Discharge instructions    Complete by:  As directed   Please follow up with your primary care physician in 1-2 weeks or sooner should any new concerns arise.     Increase activity slowly    Complete by:  As directed           Current Discharge Medication List    START taking these medications   Details  aspirin EC 81 MG EC tablet Take 1 tablet (81 mg total) by mouth daily. Qty: 30 tablet, Refills: 0    atorvastatin (LIPITOR) 40 MG tablet Take 1 tablet (40 mg total) by mouth daily at 6 PM. Qty: 30 tablet, Refills: 0    labetalol (NORMODYNE) 100 MG tablet Take 1 tablet (100 mg total) by mouth 2 (two) times daily. Qty: 60 tablet, Refills: 0      STOP taking these medications     ASHWAGANDHA PO      BLACK CURRANT SEED OIL PO      Magnesium Hydroxide (MAGNESIA PO)      Nettle, Urtica Dioica, (NETTLE LEAF PO)  OVER THE COUNTER MEDICATION      OVER THE COUNTER MEDICATION      OVER THE COUNTER MEDICATION      OVER THE COUNTER MEDICATION      OVER THE COUNTER MEDICATION      OVER THE COUNTER MEDICATION      OVER THE COUNTER MEDICATION      OVER THE COUNTER MEDICATION        Allergies  Allergen Reactions  . Iodine Anaphylaxis and Hives  . Latex Anaphylaxis  . Lisinopril Anaphylaxis  . Penicillins Anaphylaxis  . Amlodipine Other (See Comments)      The results of significant diagnostics from this hospitalization (including imaging, microbiology, ancillary and laboratory) are listed below for reference.    Significant Diagnostic Studies: Dg Chest 2 View  06/09/2015  CLINICAL DATA:  Possible TIA this morning, eyes rolling  into back of head, LEFT face and arm numbness, cough EXAM: CHEST  2 VIEW COMPARISON:  None FINDINGS: Upper normal heart size. Mediastinal contours and pulmonary vascularity normal. Atherosclerotic calcification aorta. Faint density projecting over LEFT upper lobe likely an EKG lead. No definite infiltrate, pleural effusion or pneumothorax. Bones unremarkable. IMPRESSION: No acute abnormalities. Electronically Signed   By: Ulyses Southward M.D.   On: 06/09/2015 14:31   Ct Head Wo Contrast  06/09/2015  CLINICAL DATA:  Left arm weakness and left-sided facial tingling beginning approximately at 3:30 this morning. Severe hypertension noted at the time of presentation. EXAM: CT HEAD WITHOUT CONTRAST TECHNIQUE: Contiguous axial images were obtained from the base of the skull through the vertex without intravenous contrast. COMPARISON:  None. FINDINGS: Examination is degraded secondary to patient motion artifact necessitating the acquisition of additional images. Gray-white differentiation is maintained. No CT evidence of acute large territory infarct. No intraparenchymal or extra-axial mass or hemorrhage. Normal size and configuration of the ventricles and basilar cisterns. No midline shift. There is underpneumatization of the bilateral frontal sinuses. The remaining paranasal sinuses and mastoid air cells are normally aerated. No air-fluid levels. Regional soft tissues appear normal. No displaced calvarial fracture. IMPRESSION: Negative noncontrast head CT. Electronically Signed   By: Simonne Come M.D.   On: 06/09/2015 12:35   Mr Maxine Glenn Head Wo Contrast  06/09/2015  CLINICAL DATA:  Left arm numbness and left neck pain beginning 6 hours ago. Some dizziness. EXAM: MR HEAD WITHOUT CONTRAST MR CIRCLE OF WILLIS WITHOUT CONTRAST MRA OF THE NECK WITHOUT AND WITH CONTRAST TECHNIQUE: Multiplanar, multiecho pulse sequences of the brain and surrounding structures were obtained according to standard protocol without intravenous contrast.;  Multiplanar and multiecho pulse sequences of the neck were obtained without and with intravenous contrast. Angiographic images of the neck were obtained using MRA technique without and with intravenous contast.; Angiographic images of the Circle of Willis were obtained using MRA technique without intravenous contrast. CONTRAST:  18mL MULTIHANCE GADOBENATE DIMEGLUMINE 529 MG/ML IV SOLN COMPARISON:  CT same day FINDINGS: MR HEAD FINDINGS Diffusion imaging does not show any acute or subacute infarction. There are mild chronic appearing small vessel changes of the brainstem, thalami and hemispheric white matter. Punctate foci of hemosiderin are present in the region of those infarctions. No cortical or large vessel territory infarction. No mass lesion, acute hemorrhage, hydrocephalus or extra-axial collection. No pituitary mass. No inflammatory sinus disease. MR CIRCLE OF WILLIS FINDINGS Both internal carotid arteries are widely patent into the brain. No siphon stenosis. The anterior and middle cerebral vessels are normal without proximal stenosis, aneurysm or vascular malformation. Both  vertebral arteries are normal with the left being dominant. No basilar stenosis. Posterior circulation branch vessels are normal. Right PCA takes a fetal origin from the anterior circulation. MRA NECK FINDINGS Branching pattern of the brachiocephalic vessels from the arch is normal. No origin stenosis. Both common carotid arteries are widely patent to their respective bifurcation. No carotid bifurcation narrowing or irregularity. Cervical internal carotid arteries are normal. Both vertebral arteries are normal without origin stenosis. No narrowing or irregularity. IMPRESSION: No acute brain finding. Chronic small-vessel ischemic changes of a mild degree. Punctate hemosiderin deposition associated with many of the old small vessel insults. No sign of acute hemorrhage. Normal intracranial MR angiography of the large and medium size  vessels. Normal MR angiography of the neck vessels. Electronically Signed   By: Paulina Fusi M.D.   On: 06/09/2015 14:32   Mr Angiogram Neck W Wo Contrast  06/09/2015  CLINICAL DATA:  Left arm numbness and left neck pain beginning 6 hours ago. Some dizziness. EXAM: MR HEAD WITHOUT CONTRAST MR CIRCLE OF WILLIS WITHOUT CONTRAST MRA OF THE NECK WITHOUT AND WITH CONTRAST TECHNIQUE: Multiplanar, multiecho pulse sequences of the brain and surrounding structures were obtained according to standard protocol without intravenous contrast.; Multiplanar and multiecho pulse sequences of the neck were obtained without and with intravenous contrast. Angiographic images of the neck were obtained using MRA technique without and with intravenous contast.; Angiographic images of the Circle of Willis were obtained using MRA technique without intravenous contrast. CONTRAST:  18mL MULTIHANCE GADOBENATE DIMEGLUMINE 529 MG/ML IV SOLN COMPARISON:  CT same day FINDINGS: MR HEAD FINDINGS Diffusion imaging does not show any acute or subacute infarction. There are mild chronic appearing small vessel changes of the brainstem, thalami and hemispheric white matter. Punctate foci of hemosiderin are present in the region of those infarctions. No cortical or large vessel territory infarction. No mass lesion, acute hemorrhage, hydrocephalus or extra-axial collection. No pituitary mass. No inflammatory sinus disease. MR CIRCLE OF WILLIS FINDINGS Both internal carotid arteries are widely patent into the brain. No siphon stenosis. The anterior and middle cerebral vessels are normal without proximal stenosis, aneurysm or vascular malformation. Both vertebral arteries are normal with the left being dominant. No basilar stenosis. Posterior circulation branch vessels are normal. Right PCA takes a fetal origin from the anterior circulation. MRA NECK FINDINGS Branching pattern of the brachiocephalic vessels from the arch is normal. No origin stenosis. Both  common carotid arteries are widely patent to their respective bifurcation. No carotid bifurcation narrowing or irregularity. Cervical internal carotid arteries are normal. Both vertebral arteries are normal without origin stenosis. No narrowing or irregularity. IMPRESSION: No acute brain finding. Chronic small-vessel ischemic changes of a mild degree. Punctate hemosiderin deposition associated with many of the old small vessel insults. No sign of acute hemorrhage. Normal intracranial MR angiography of the large and medium size vessels. Normal MR angiography of the neck vessels. Electronically Signed   By: Paulina Fusi M.D.   On: 06/09/2015 14:32   Mr Brain Wo Contrast  06/09/2015  CLINICAL DATA:  Left arm numbness and left neck pain beginning 6 hours ago. Some dizziness. EXAM: MR HEAD WITHOUT CONTRAST MR CIRCLE OF WILLIS WITHOUT CONTRAST MRA OF THE NECK WITHOUT AND WITH CONTRAST TECHNIQUE: Multiplanar, multiecho pulse sequences of the brain and surrounding structures were obtained according to standard protocol without intravenous contrast.; Multiplanar and multiecho pulse sequences of the neck were obtained without and with intravenous contrast. Angiographic images of the neck were obtained using MRA technique  without and with intravenous contast.; Angiographic images of the Circle of Willis were obtained using MRA technique without intravenous contrast. CONTRAST:  18mL MULTIHANCE GADOBENATE DIMEGLUMINE 529 MG/ML IV SOLN COMPARISON:  CT same day FINDINGS: MR HEAD FINDINGS Diffusion imaging does not show any acute or subacute infarction. There are mild chronic appearing small vessel changes of the brainstem, thalami and hemispheric white matter. Punctate foci of hemosiderin are present in the region of those infarctions. No cortical or large vessel territory infarction. No mass lesion, acute hemorrhage, hydrocephalus or extra-axial collection. No pituitary mass. No inflammatory sinus disease. MR CIRCLE OF WILLIS  FINDINGS Both internal carotid arteries are widely patent into the brain. No siphon stenosis. The anterior and middle cerebral vessels are normal without proximal stenosis, aneurysm or vascular malformation. Both vertebral arteries are normal with the left being dominant. No basilar stenosis. Posterior circulation branch vessels are normal. Right PCA takes a fetal origin from the anterior circulation. MRA NECK FINDINGS Branching pattern of the brachiocephalic vessels from the arch is normal. No origin stenosis. Both common carotid arteries are widely patent to their respective bifurcation. No carotid bifurcation narrowing or irregularity. Cervical internal carotid arteries are normal. Both vertebral arteries are normal without origin stenosis. No narrowing or irregularity. IMPRESSION: No acute brain finding. Chronic small-vessel ischemic changes of a mild degree. Punctate hemosiderin deposition associated with many of the old small vessel insults. No sign of acute hemorrhage. Normal intracranial MR angiography of the large and medium size vessels. Normal MR angiography of the neck vessels. Electronically Signed   By: Paulina Fusi M.D.   On: 06/09/2015 14:32    Microbiology: No results found for this or any previous visit (from the past 240 hour(s)).   Labs: Basic Metabolic Panel:  Recent Labs Lab 06/09/15 1248 06/09/15 1259 06/09/15 1556  NA 141 142  --   K 3.9 4.0  --   CL 107 106  --   CO2 24  --   --   GLUCOSE 108* 108*  --   BUN 17 18  --   CREATININE 1.03* 1.00 0.94  CALCIUM 9.5  --   --    Liver Function Tests:  Recent Labs Lab 06/09/15 1248  AST 19  22  ALT 22  24  ALKPHOS 70  72  BILITOT 0.5  0.6  PROT 7.4  8.0  ALBUMIN 3.9  4.1   No results for input(s): LIPASE, AMYLASE in the last 168 hours.  Recent Labs Lab 06/09/15 1248  AMMONIA 32   CBC:  Recent Labs Lab 06/09/15 1248 06/09/15 1259 06/09/15 1556  WBC 4.3  --  4.8  NEUTROABS 1.8  --   --   HGB  11.9* 12.9 12.2  HCT 37.3 38.0 37.0  MCV 89.7  --  89.2  PLT 182  --  177   Cardiac Enzymes:  Recent Labs Lab 06/09/15 1248  CKTOTAL 169   BNP: BNP (last 3 results) No results for input(s): BNP in the last 8760 hours.  ProBNP (last 3 results) No results for input(s): PROBNP in the last 8760 hours.  CBG: No results for input(s): GLUCAP in the last 168 hours.  Signed:  Penny Pia MD.  [facility] 06/12/2015, 10:23 AM

## 2015-06-12 NOTE — Progress Notes (Signed)
Discharge orders received, Pt for discharge home. IV d/c'd. D/c instructions and RX given with verbalized understanding. Family at bedside to assist patient with discharge. Staff bought pt downstairs via wheelchair. 1646 06/12/15.

## 2015-06-12 NOTE — Progress Notes (Signed)
SLP Cancellation Note  Patient Details Name: Dorothy RabonRene Gilbert MRN: [id number] DOB: 03/10/1961   Cancelled treatment:       Reason Eval/Treat Not Completed: chart screened, no needs identified, will sign off   Blenda MountsCouture, Nicholai Willette Laurice 06/12/2015, 3:31 PM

## 2015-06-18 ENCOUNTER — Inpatient Hospital Stay: Payer: Self-pay | Admitting: Internal Medicine

## 2015-06-19 ENCOUNTER — Ambulatory Visit: Payer: Self-pay | Attending: Internal Medicine | Admitting: Internal Medicine

## 2015-06-19 ENCOUNTER — Other Ambulatory Visit: Payer: Self-pay

## 2015-06-19 ENCOUNTER — Encounter: Payer: Self-pay | Admitting: Internal Medicine

## 2015-06-19 VITALS — BP 170/108 | HR 62 | Temp 98.0°F | Resp 16 | Wt 198.0 lb

## 2015-06-19 DIAGNOSIS — E785 Hyperlipidemia, unspecified: Secondary | ICD-10-CM

## 2015-06-19 DIAGNOSIS — I160 Hypertensive urgency: Secondary | ICD-10-CM | POA: Insufficient documentation

## 2015-06-19 DIAGNOSIS — Z880 Allergy status to penicillin: Secondary | ICD-10-CM | POA: Insufficient documentation

## 2015-06-19 DIAGNOSIS — G459 Transient cerebral ischemic attack, unspecified: Secondary | ICD-10-CM | POA: Insufficient documentation

## 2015-06-19 DIAGNOSIS — R300 Dysuria: Secondary | ICD-10-CM

## 2015-06-19 DIAGNOSIS — Z79899 Other long term (current) drug therapy: Secondary | ICD-10-CM | POA: Insufficient documentation

## 2015-06-19 DIAGNOSIS — Z8673 Personal history of transient ischemic attack (TIA), and cerebral infarction without residual deficits: Secondary | ICD-10-CM | POA: Insufficient documentation

## 2015-06-19 DIAGNOSIS — Z7982 Long term (current) use of aspirin: Secondary | ICD-10-CM | POA: Insufficient documentation

## 2015-06-19 DIAGNOSIS — I10 Essential (primary) hypertension: Secondary | ICD-10-CM | POA: Insufficient documentation

## 2015-06-19 MED ORDER — CLONIDINE HCL 0.2 MG PO TABS
0.2000 mg | ORAL_TABLET | Freq: Once | ORAL | Status: AC
  Administered 2015-06-19: 0.2 mg via ORAL

## 2015-06-19 MED ORDER — LABETALOL HCL 100 MG PO TABS: 100.0000 mg | ORAL_TABLET | Freq: Three times a day (TID) | ORAL | Status: DC

## 2015-06-19 MED ORDER — LABETALOL HCL 100 MG PO TABS: 100.0000 mg | ORAL_TABLET | Freq: Three times a day (TID) | ORAL | Status: AC

## 2015-06-19 MED FILL — LABETALOL HCL 100 MG TABLET: 100 | 20 days supply | Qty: 60 | Fill #0

## 2015-06-19 NOTE — Patient Instructions (Addendum)
- fu w/ [facility] 2 wks for bp check - trial Red Yeast rice or Welchol if want to avoid statins for now.  Exercise!  It was a pleasure meeting you.  F/u w/ me in 2-3 months, unless something pops up.      -Hypertension Hypertension, commonly called high blood pressure, is when the force of blood pumping through your arteries is too strong. Your arteries are the blood vessels that carry blood from your heart throughout your body. A blood pressure reading consists of a higher number over a lower number, such as 110/72. The higher number (systolic) is the pressure inside your arteries when your heart pumps. The lower number (diastolic) is the pressure inside your arteries when your heart relaxes. Ideally you want your blood pressure below 120/80. Hypertension forces your heart to work harder to pump blood. Your arteries may become narrow or stiff. Having untreated or uncontrolled hypertension can cause heart attack, stroke, kidney disease, and other problems. RISK FACTORS Some risk factors for high blood pressure are controllable. Others are not.  Risk factors you cannot control include:   Race. You may be at higher risk if you are African American.  Age. Risk increases with age.  Gender. Men are at higher risk than women before age 55 years. After age 55, women are at higher risk than men. Risk factors you can control include:  Not getting enough exercise or physical activity.  Being overweight.  Getting too much fat, sugar, calories, or salt in your diet.  Drinking too much alcohol. SIGNS AND SYMPTOMS Hypertension does not usually cause signs or symptoms. Extremely high blood pressure (hypertensive crisis) may cause headache, anxiety, shortness of breath, and nosebleed. DIAGNOSIS To check if you have hypertension, your health care provider will measure your blood pressure while you are seated, with your arm held at the level of your heart. It should be measured at least twice  using the same arm. Certain conditions can cause a difference in blood pressure between your right and left arms. A blood pressure reading that is higher than normal on one occasion does not mean that you need treatment. If it is not clear whether you have high blood pressure, you may be asked to return on a different day to have your blood pressure checked again. Or, you may be asked to monitor your blood pressure at home for 1 or more weeks. TREATMENT Treating high blood pressure includes making lifestyle changes and possibly taking medicine. Living a healthy lifestyle can help lower high blood pressure. You may need to change some of your habits. Lifestyle changes may include:  Following the DASH diet. This diet is high in fruits, vegetables, and whole grains. It is low in salt, red meat, and added sugars.  Keep your sodium intake below 2,000 mg per day.  Getting at least 30-45 minutes of aerobic exercise at least 4 times per week.  Losing weight if necessary.  Not smoking.  Limiting alcoholic beverages.  Learning ways to reduce stress. Your health care provider may prescribe medicine if lifestyle changes are not enough to get your blood pressure under control, and if one of the following is true:  You are 6918-55 years of age and your systolic blood pressure is above 140.  You are 55 years of age or older, and your systolic blood pressure is above 150.  Your diastolic blood pressure is above 90.  You have diabetes, and your systolic blood pressure is over 140 or your  diastolic blood pressure is over 90.  You have kidney disease and your blood pressure is above 140/90.  You have heart disease and your blood pressure is above 140/90. Your personal target blood pressure may vary depending on your medical conditions, your age, and other factors. HOME CARE INSTRUCTIONS  Have your blood pressure rechecked as directed by your health care provider.   Take medicines only as directed by  your health care provider. Follow the directions carefully. Blood pressure medicines must be taken as prescribed. The medicine does not work as well when you skip doses. Skipping doses also puts you at risk for problems.  Do not smoke.   Monitor your blood pressure at home as directed by your health care provider. SEEK MEDICAL CARE IF:   You think you are having a reaction to medicines taken.  You have recurrent headaches or feel dizzy.  You have swelling in your ankles.  You have trouble with your vision. SEEK IMMEDIATE MEDICAL CARE IF:  You develop a severe headache or confusion.  You have unusual weakness, numbness, or feel faint.  You have severe chest or abdominal pain.  You vomit repeatedly.  You have trouble breathing. MAKE SURE YOU:   Understand these instructions.  Will watch your condition.  Will get help right away if you are not doing well or get worse.   This information is not intended to replace advice given to you by your health care provider. Make sure you discuss any questions you have with your health care provider.   Document Released: 03/24/2005 Document Revised: 08/08/2014 Document Reviewed: 01/14/2013 Elsevier Interactive Patient Education 2016 Elsevier Inc.   Heart-Healthy Eating Plan Many factors influence your heart health, including eating and exercise habits. Heart (coronary) risk increases with abnormal blood fat (lipid) levels. Heart-healthy meal planning includes limiting unhealthy fats, increasing healthy fats, and making other small dietary changes. This includes maintaining a healthy body weight to help keep lipid levels within a normal range. WHAT IS MY PLAN?  Your health care provider recommends that you:  Get no more than 20 % of the total calories in your daily diet from fat.  Limit the amount of salt in your diet to less than ___2,000______ mg per day. WHAT TYPES OF FAT SHOULD I CHOOSE?  Choose healthy fats more often. Choose  monounsaturated and polyunsaturated fats, such as olive oil and canola oil, flaxseeds, walnuts, almonds, and seeds.  Eat more omega-3 fats. Good choices include salmon, mackerel, sardines, tuna, flaxseed oil, and ground flaxseeds. Aim to eat fish at least two times each week.  Limit saturated fats. Saturated fats are primarily found in animal products, such as meats, butter, and cream. Plant sources of saturated fats include palm oil, palm kernel oil, and coconut oil.  Avoid foods with partially hydrogenated oils in them. These contain trans fats. Examples of foods that contain trans fats are stick margarine, some tub margarines, cookies, crackers, and other baked goods. WHAT GENERAL GUIDELINES DO I NEED TO FOLLOW?  Check food labels carefully to identify foods with trans fats or high amounts of saturated fat.  Fill one half of your plate with vegetables and green salads. Eat 4-5 servings of vegetables per day. A serving of vegetables equals 1 cup of raw leafy vegetables,  cup of raw or cooked cut-up vegetables, or  cup of vegetable juice.  Fill one fourth of your plate with whole grains. Look for the word "whole" as the first word in the ingredient list.  Fill  one fourth of your plate with lean protein foods.  Eat 4-5 servings of fruit per day. A serving of fruit equals one medium whole fruit,  cup of dried fruit,  cup of fresh, frozen, or canned fruit, or  cup of 100% fruit juice.  Eat more foods that contain soluble fiber. Examples of foods that contain this type of fiber are apples, broccoli, carrots, beans, peas, and barley. Aim to get 20-30 g of fiber per day.  Eat more home-cooked food and less restaurant, buffet, and fast food.  Limit or avoid alcohol.  Limit foods that are high in starch and sugar.  Avoid fried foods.  Cook foods by using methods other than frying. Baking, boiling, grilling, and broiling are all great options. Other fat-reducing suggestions  include:  Removing the skin from poultry.  Removing all visible fats from meats.  Skimming the fat off of stews, soups, and gravies before serving them.  Steaming vegetables in water or broth.  Lose weight if you are overweight. Losing just 5-10% of your initial body weight can help your overall health and prevent diseases such as diabetes and heart disease.  Increase your consumption of nuts, legumes, and seeds to 4-5 servings per week. One serving of dried beans or legumes equals  cup after being cooked, one serving of nuts equals 1 ounces, and one serving of seeds equals  ounce or 1 tablespoon.  You may need to monitor your salt (sodium) intake, especially if you have high blood pressure. Talk with your health care provider or dietitian to get more information about reducing sodium. WHAT FOODS CAN I EAT? Grains Breads, including [place], white, pita, wheat, raisin, rye, oatmeal, and [place]. Tortillas that are neither fried nor made with lard or trans fat. Low-fat rolls, including hotdog and hamburger buns and English muffins. Biscuits. Muffins. Waffles. Pancakes. Light popcorn. Whole-grain cereals. Flatbread. Melba toast. Pretzels. Breadsticks. Rusks. Low-fat snacks and crackers, including oyster, saltine, matzo, graham, animal, and rye. Rice and pasta, including brown rice and those that are made with whole wheat. Vegetables All vegetables. Fruits All fruits, but limit coconut. Meats and Other Protein Sources Lean, well-trimmed beef, veal, pork, and lamb. Chicken and [place] without skin. All fish and shellfish. Wild duck, rabbit, pheasant, and venison. Egg whites or low-cholesterol egg substitutes. Dried beans, peas, lentils, and tofu.Seeds and most nuts. Dairy Low-fat or nonfat cheeses, including ricotta, string, and mozzarella. Skim or 1% milk that is liquid, powdered, or evaporated. Buttermilk that is made with low-fat milk. Nonfat or low-fat yogurt. Beverages Mineral water.  Diet carbonated beverages. Sweets and Desserts Sherbets and fruit ices. Honey, jam, marmalade, jelly, and syrups. Meringues and gelatins. Pure sugar candy, such as hard candy, jelly beans, gumdrops, mints, marshmallows, and small amounts of dark chocolate. [employer]. Eat all sweets and desserts in moderation. Fats and Oils Nonhydrogenated (trans-free) margarines. Vegetable oils, including soybean, sesame, sunflower, olive, peanut, safflower, corn, canola, and cottonseed. Salad dressings or mayonnaise that are made with a vegetable oil. Limit added fats and oils that you use for cooking, baking, salads, and as spreads. Other Cocoa powder. Coffee and tea. All seasonings and condiments. The items listed above may not be a complete list of recommended foods or beverages. Contact your dietitian for more options. WHAT FOODS ARE NOT RECOMMENDED? Grains Breads that are made with saturated or trans fats, oils, or whole milk. Croissants. Butter rolls. Cheese breads. Sweet rolls. Donuts. Buttered popcorn. Chow mein noodles. High-fat crackers, such as cheese or butter  crackers. Meats and Other Protein Sources Fatty meats, such as hotdogs, short ribs, sausage, spareribs, bacon, ribeye roast or steak, and mutton. High-fat deli meats, such as salami and bologna. Caviar. Domestic duck and goose. Organ meats, such as kidney, liver, sweetbreads, brains, gizzard, chitterlings, and heart. Dairy Cream, sour cream, cream cheese, and creamed cottage cheese. Whole milk cheeses, including blue (bleu), [address], [place], [place], [address], [place], [address], [place], [place], and [place]. Whole or 2% milk that is liquid, evaporated, or condensed. Whole buttermilk. Cream sauce or high-fat cheese sauce. Yogurt that is made from whole milk. Beverages Regular sodas and drinks with added sugar. Sweets and Desserts Frosting. Pudding. Cookies. Cakes other than angel food cake. Candy that has milk chocolate or white  chocolate, hydrogenated fat, butter, coconut, or unknown ingredients. Buttered syrups. Full-fat ice cream or ice cream drinks. Fats and Oils Gravy that has suet, meat fat, or shortening. Cocoa butter, hydrogenated oils, palm oil, coconut oil, palm kernel oil. These can often be found in baked products, candy, fried foods, nondairy creamers, and whipped toppings. Solid fats and shortenings, including bacon fat, salt pork, lard, and butter. Nondairy cream substitutes, such as coffee creamers and sour cream substitutes. Salad dressings that are made of unknown oils, cheese, or sour cream. The items listed above may not be a complete list of foods and beverages to avoid. Contact your dietitian for more information.   This information is not intended to replace advice given to you by your health care provider. Make sure you discuss any questions you have with your health care provider.   Document Released: 01/01/2008 Document Revised: 04/14/2014 Document Reviewed: 09/15/2013 Elsevier Interactive Patient Education [employer]

## 2015-06-19 NOTE — Progress Notes (Signed)
Dorothy Gilbert, is a 55 y.o. female  [id number]  [id number]  DOB - [birth year]  CC:  Chief Complaint  Patient presents with  . Transient Ischemic Attack  . Hypertension  . Hospitalization Follow-up       HPI: Dorothy Gilbert is a 55 y.o. female here today to establish medical care.  Recent hospitalization 3/4-06/12/15 for htn urgency.   Extensive workup done at time including carotid US, le doppers, mri brain, eeg and echo were essentially negative.  Pt has a hx of using [facility], hx of addrenal abnormality, for which she states had normalized these last 3 years w/ [facility] where she lives ([place]?).  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   On 3/4 she was noted to have tia sxs/left sided facial numbness, and consequently admitted for further wkup.  She has numerous allergies to meds/latex, and did not want to take the statin prescribed, asked for other [facility] meds recd for cholesterol today.  States she monitors her bp at home closely, and they have been very labile, sbp ranging 120s to 160s at home.  Patient has No headache, No chest pain, No abdominal pain - No Nausea, No new weakness tingling or numbness, No Cough - SOB.  Allergies  Allergen Reactions  . Iodine Anaphylaxis and Hives  . Latex Anaphylaxis  . Lisinopril Anaphylaxis  . Penicillins Anaphylaxis  . Amlodipine Other (See Comments)   Past Medical History  Diagnosis Date  . Hypertension    Current Outpatient Prescriptions on File Prior to Visit  Medication Sig Dispense Refill  . aspirin EC 81 MG EC tablet Take 1 tablet (81 mg total) by mouth daily. 30 tablet 0  . labetalol (NORMODYNE) 100 MG tablet Take 1 tablet (100 mg total) by mouth 2 (two) times daily. 60 tablet 0  . atorvastatin (LIPITOR) 40 MG tablet Take 1 tablet (40 mg total) by mouth daily at 6 PM. (Patient not taking: Reported on 06/19/2015) 30 tablet 0   No current facility-administered medications  on file prior to visit.   Family History  Problem Relation Age of Onset  . Diabetes Mother   . Diabetes Father   . Hypertension Father    Social History   Social History  . Marital Status: Single    Spouse Name: N/A  . Number of Children: N/A  . Years of Education: N/A   Occupational History  . Not on file.   Social History Main Topics  . Smoking status: Never Smoker   . Smokeless tobacco: Not on file  . Alcohol Use: No  . Drug Use: No  . Sexual Activity: No   Other Topics Concern  . Not on file   Social History Narrative    Review of Systems: Constitutional: Negative for fever, chills, diaphoresis, activity change, appetite change and fatigue.  +weight gain 20lbs recently. HENT: Negative for ear pain, nosebleeds, congestion, facial swelling, rhinorrhea, neck pain, neck stiffness and ear discharge.  Eyes: Negative for pain, discharge, redness, itching and visual disturbance. Respiratory: Negative for cough, choking, chest tightness, shortness of breath, wheezing and stridor.  Cardiovascular: Negative for chest pain, palpitations and leg swelling. Gastrointestinal: Negative for abdominal distention. Genitourinary: Negative for dysuria, urgency, frequency, hematuria, flank pain, decreased urine volume, difficulty urinating and dyspareunia.  Musculoskeletal: Negative for back pain, joint swelling, arthralgia and gait problem. Neurological: Negative for dizziness, tremors, seizures, syncope, facial asymmetry, speech difficulty, weakness, light-headedness, numbness and headaches.  Hematological: Negative  for adenopathy. Does not bruise/bleed easily. Psychiatric/Behavioral: Negative for hallucinations, behavioral problems, confusion, dysphoric mood, decreased concentration and agitation.    Objective:   Filed Vitals:   06/19/15 1111 06/19/15 1156  BP: 175/119 170/108  Pulse: 62   Temp: 98 F (36.7 C)   Resp: 16    Repeat after clonidine 0.2mg / 170/108  Physical  Exam: Constitutional: Patient appears well-developed and well-nourished. No distress.  Pleasant, aaox3 HENT: Normocephalic, atraumatic, External right and left ear normal. Oropharynx is clear and moist.  Eyes: Conjunctivae and EOM are normal. PERRL, no scleral icterus. Neck: Normal ROM. Neck supple. No JVD. No tracheal deviation. No thyromegaly. CVS: RRR, S1/S2 +, no murmurs, no gallops, no carotid bruit.  Pulmonary: Effort and breath sounds normal, no stridor, rhonchi, wheezes, rales.  Abdominal: Soft. Obese. BS +, no distension, tenderness, rebound or guarding.  Musculoskeletal: Normal range of motion. No edema and no tenderness.  Lymphadenopathy: No lymphadenopathy noted, cervical, inguinal or axillary Neuro: Alert. Normal reflexes, muscle tone coordination. No cranial nerve deficit. No facial droop.  No gross deficits noted. Speech normal. Skin: Skin is warm and dry. No rash noted. Not diaphoretic. No erythema. No pallor. Psychiatric: Normal mood and affect. Behavior, judgment, thought content normal.  Lab Results  Component Value Date   WBC 4.8 06/09/2015   HGB 12.2 06/09/2015   HCT 37.0 06/09/2015   MCV 89.2 06/09/2015   PLT 177 06/09/2015   Lab Results  Component Value Date   CREATININE 0.94 06/09/2015   BUN 18 06/09/2015   NA 142 06/09/2015   K 4.0 06/09/2015   CL 106 06/09/2015   CO2 24 06/09/2015    Lab Results  Component Value Date   HGBA1C 6.1* 06/10/2015   Lipid Panel     Component Value Date/Time   CHOL 195 06/10/2015 0212   TRIG 93 06/10/2015 0212   HDL 55 06/10/2015 0212   CHOLHDL 3.5 06/10/2015 0212   VLDL 19 06/10/2015 0212   LDLCALC 121* 06/10/2015 0212     rads/labs reviewed  3/4 cxr 2 c ntd 3/4 mra brain  IMPRESSION: No acute brain finding. Chronic small-vessel ischemic changes of a mild degree. Punctate hemosiderin deposition associated with many of the old small vessel insults. No sign of acute hemorrhage.  Normal intracranial MR  angiography of the large and medium size vessels.  Normal MR angiography of the neck vessels.  3/4 mri brain IMPRESSION: No acute brain finding. Chronic small-vessel ischemic changes of a mild degree. Punctate hemosiderin deposition associated with many of the old small vessel insults. No sign of acute hemorrhage.  Normal intracranial MR angiography of the large and medium size vessels.  Normal MR angiography of the neck vessels.  3/6 eeg IMPRESSION:  This EEG demonstrated no focal, hemispheric, or lateralizing  features. There was no epileptiform activity recorded. There  was an excess amount of fast (beta) activity recorded which is  usually due to medications such as benzodiazepines.   3/5 tte Study Conclusions  - Left ventricle: The cavity size was normal. Systolic function was  normal. The estimated ejection fraction was in the range of 60%  to 65%. Wall motion was normal; there were no regional wall  motion abnormalities. Features are consistent with a pseudonormal  left ventricular filling pattern, with concomitant abnormal  relaxation and increased filling pressure (grade 2 diastolic  dysfunction). - Left atrium: The atrium was mildly dilated. - Atrial septum: A patent foramen ovale cannot be excluded. Assessment and plan:   1.  Accelerated hypertension bp improved w/ clonidine protocol: - cloNIDine (CATAPRES) tablet 0.2 mg; Take 1 tablet (0.2 mg total) by mouth once. - will increase labetolol to 100tid, keep bp log, - fu w/ [facility] in 2 wks for bp chk.  2. Recent tia due to htn urgency - encouraged close follow, heart healthy diet.  3. Hyperlipidemia LDL goal <70 - declined statin due to medical concerns/allergies - 3/5 ldl 121, hdl 55, chol 195, tg 93 - recd trying red yeast rice  Or wellchol if she is adament about avoiding statins at all possible, risk/benefits of herbals discussed extensively during checkup.  4. Health  maintenance/ - ?records on mammogram/pap/cscope, pt not certain if wll continue w/ our clinic while here in [place].  Has [facility] doctor in mind to followup later with as well.  Return in about 2 months (around 08/19/2015).  The patient was given clear instructions to go to ER or return to medical center if symptoms don't improve, worsen or new problems develop. The patient verbalized understanding. The patient was told to call to get lab results if they haven't heard anything in the next week.     Pete Glatter, MD, MBA/MHA [facility] [place], [place] [phone]   06/19/2015, 11:57 AM

## 2015-06-19 NOTE — Progress Notes (Signed)
Patient's here for hospital f/up TIA, HTN. Patient states she been on labetalol, BP has not stabilized  in last 7 days. No sxs of dizziness.   Patient denies any pain, but states she's feels tired all the time.  Patient thinks she may have a kidney infxn.  Patient declines diabetes screening and flu shot.

## 2015-07-03 ENCOUNTER — Encounter: Payer: Self-pay | Admitting: Pharmacist

## 2018-01-05 IMAGING — CT CT HEAD W/O CM
1 of 2 series · 15 of 30 positions shown, 19 images · non-contrast
Comparison: None.

CLINICAL DATA: Left arm weakness and left-sided facial tingling
beginning approximately at [DATE] this morning. Severe hypertension
noted at the time of presentation.

EXAM:
CT HEAD WITHOUT CONTRAST
TECHNIQUE: Contiguous axial images were obtained from the base of the skull
through the vertex without intravenous contrast.

[Series 2: head 5.0 h30s · axial · 0.42mm/px · z∈[-77,+53]mm · 15 of 30 slices shown, 19 images]
[im 2/30  brain]
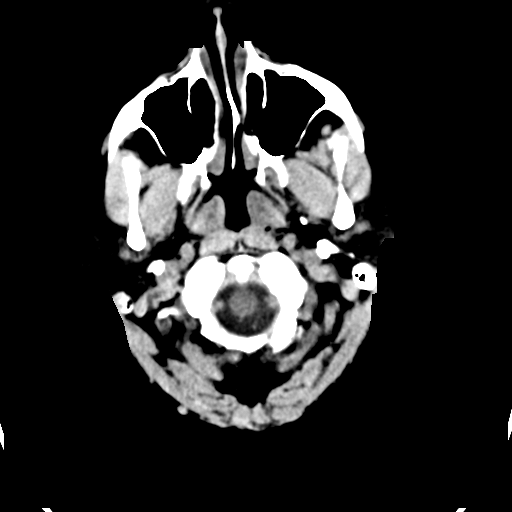
[im 2/30  bone]
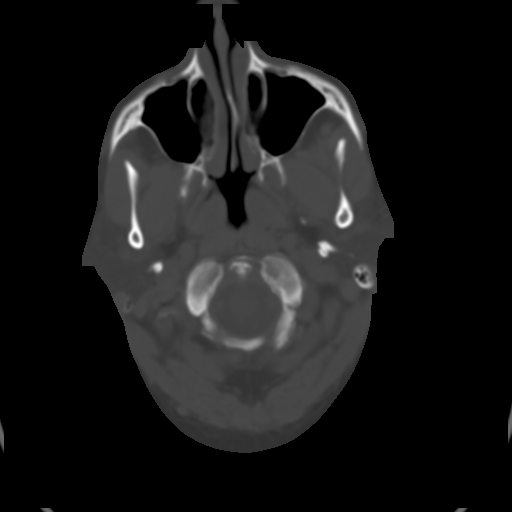
[im 4/30  brain]
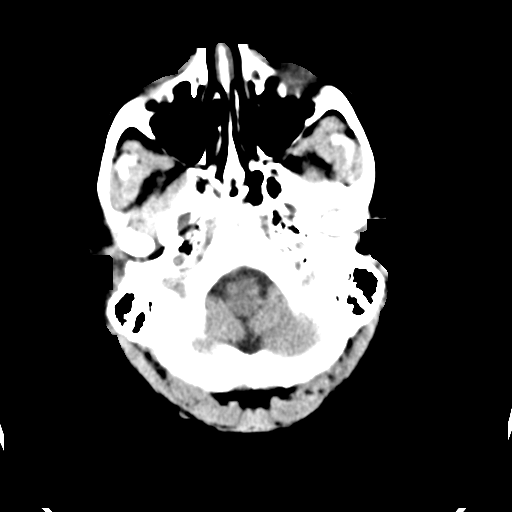
[im 6/30  brain]
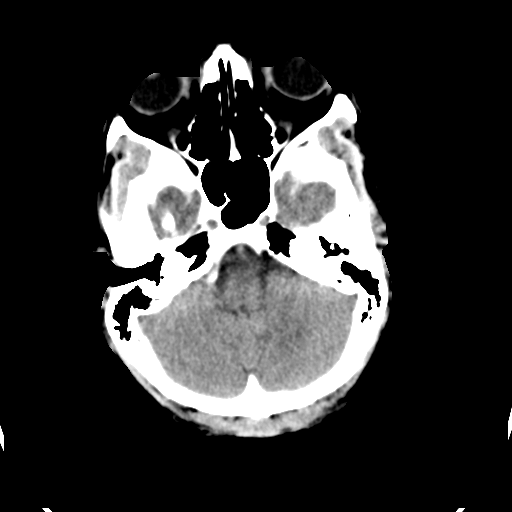
[im 7/30  brain]
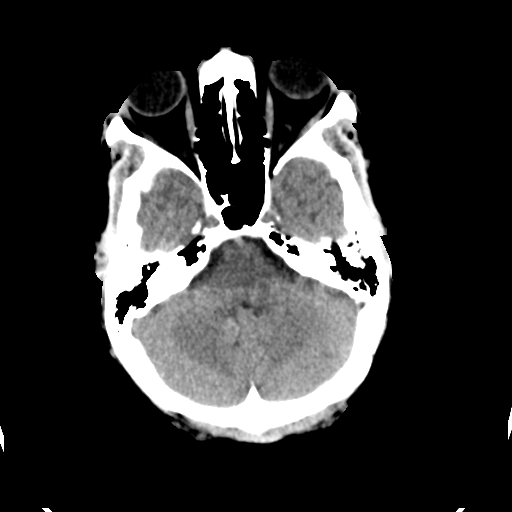
[im 9/30  brain]
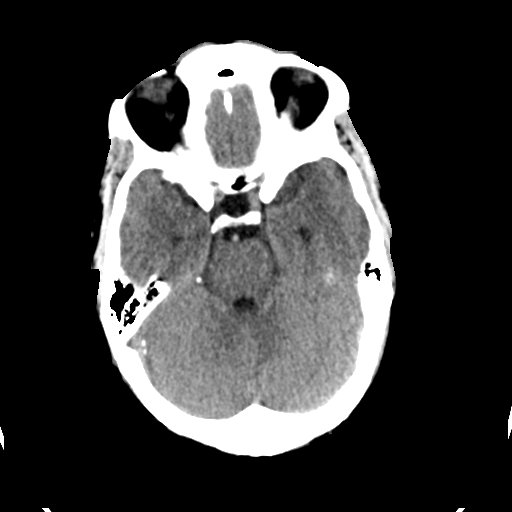
[im 9/30  bone]
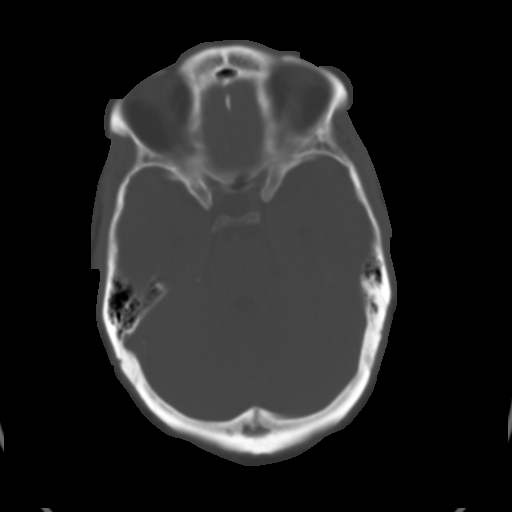
[im 11/30  brain]
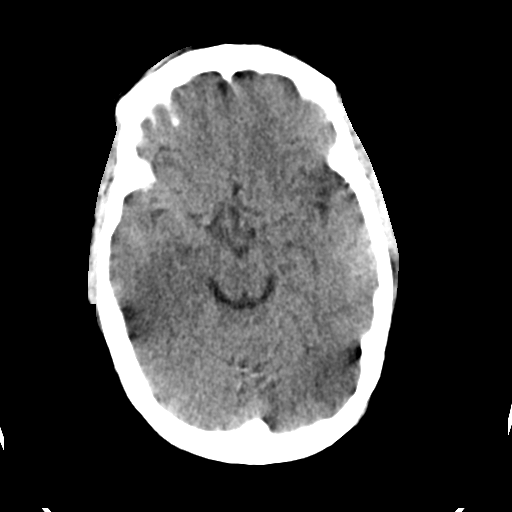
[im 12/30  brain]
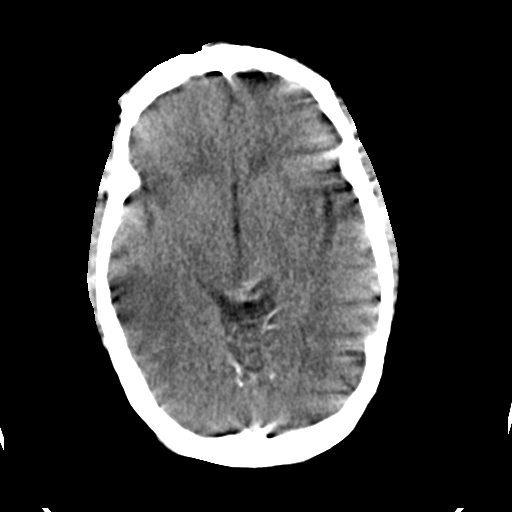
[im 16/30  brain]
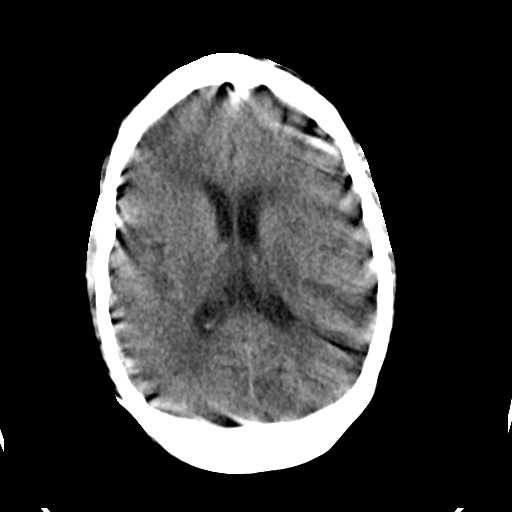
[im 18/30  brain]
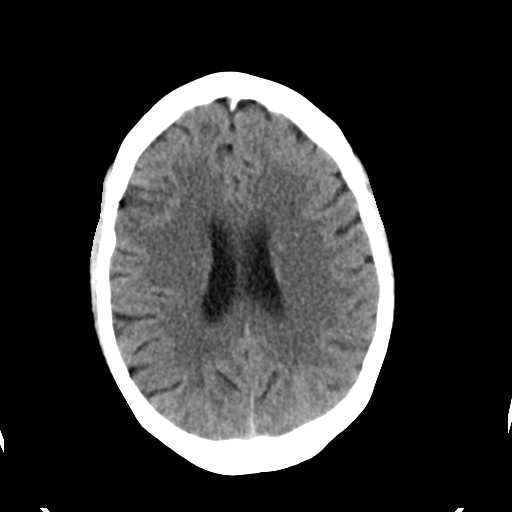
[im 18/30  bone]
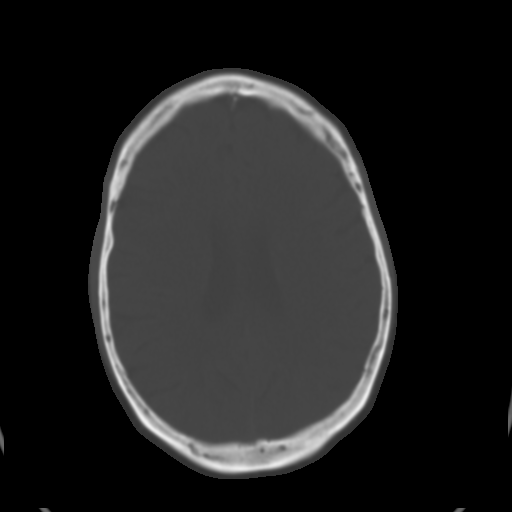
[im 19/30  brain]
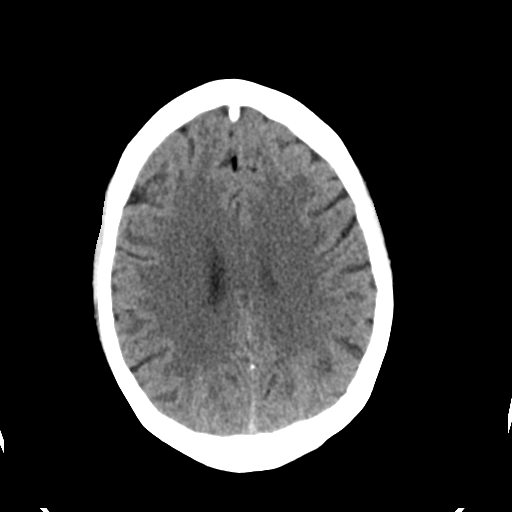
[im 21/30  brain]
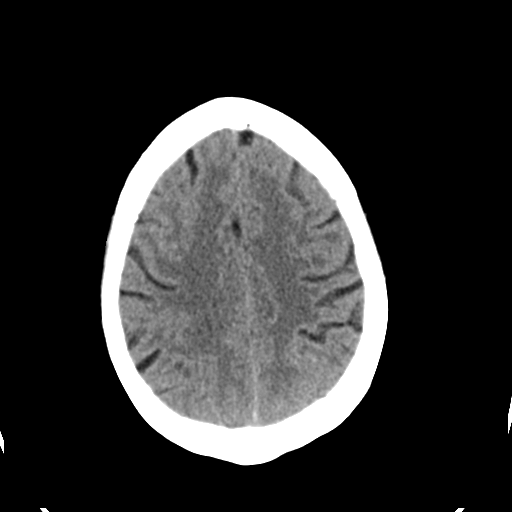
[im 23/30  brain]
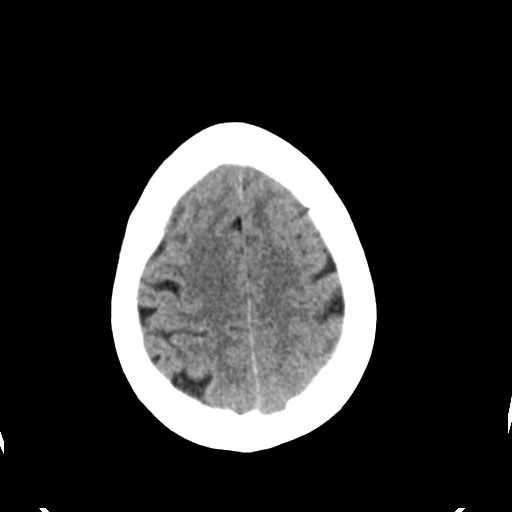
[im 24/30  brain]
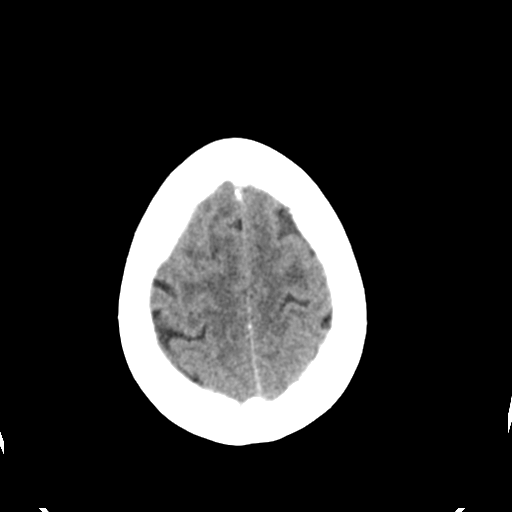
[im 24/30  bone]
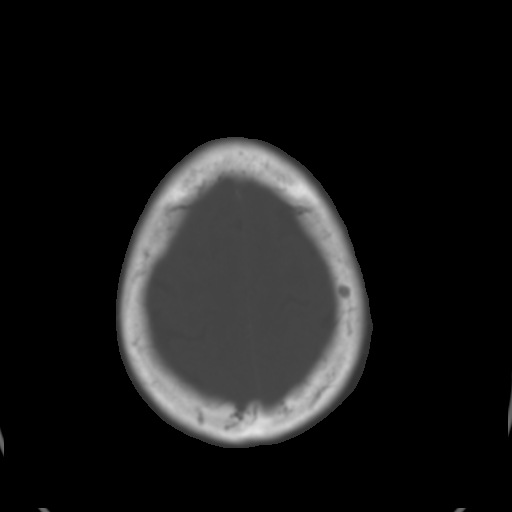
[im 26/30  brain]
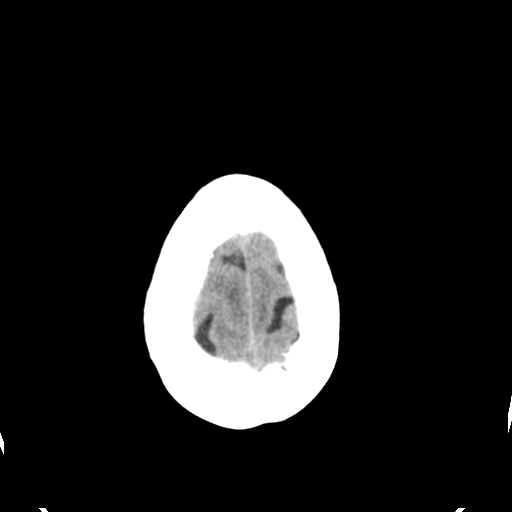
[im 28/30  brain]
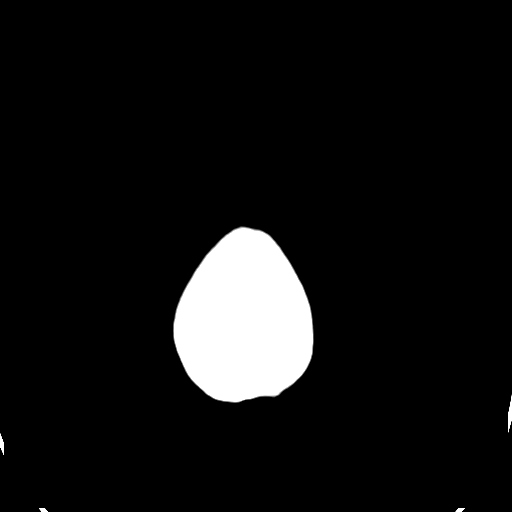

[15 of 30 positions shown; findings below may reference images not displayed]

FINDINGS: Examination is degraded secondary to patient motion artifact
necessitating the acquisition of additional images.

Gray-white differentiation is maintained. No CT evidence of acute
large territory infarct. No intraparenchymal or extra-axial mass or
hemorrhage. Normal size and configuration of the ventricles and
basilar cisterns. No midline shift. There is underpneumatization of
the bilateral frontal sinuses. The remaining paranasal sinuses and
mastoid air cells are normally aerated. No air-fluid levels.
Regional soft tissues appear normal. No displaced calvarial
fracture.
IMPRESSION: Negative noncontrast head CT.

## 2018-01-05 IMAGING — DX DG CHEST 2V
2 series · 2 of 2 positions shown · non-contrast
Comparison: None

CLINICAL DATA: Possible TIA this morning, eyes rolling into back of
head, LEFT face and arm numbness, cough

EXAM:
CHEST  2 VIEW

[w chest pa]
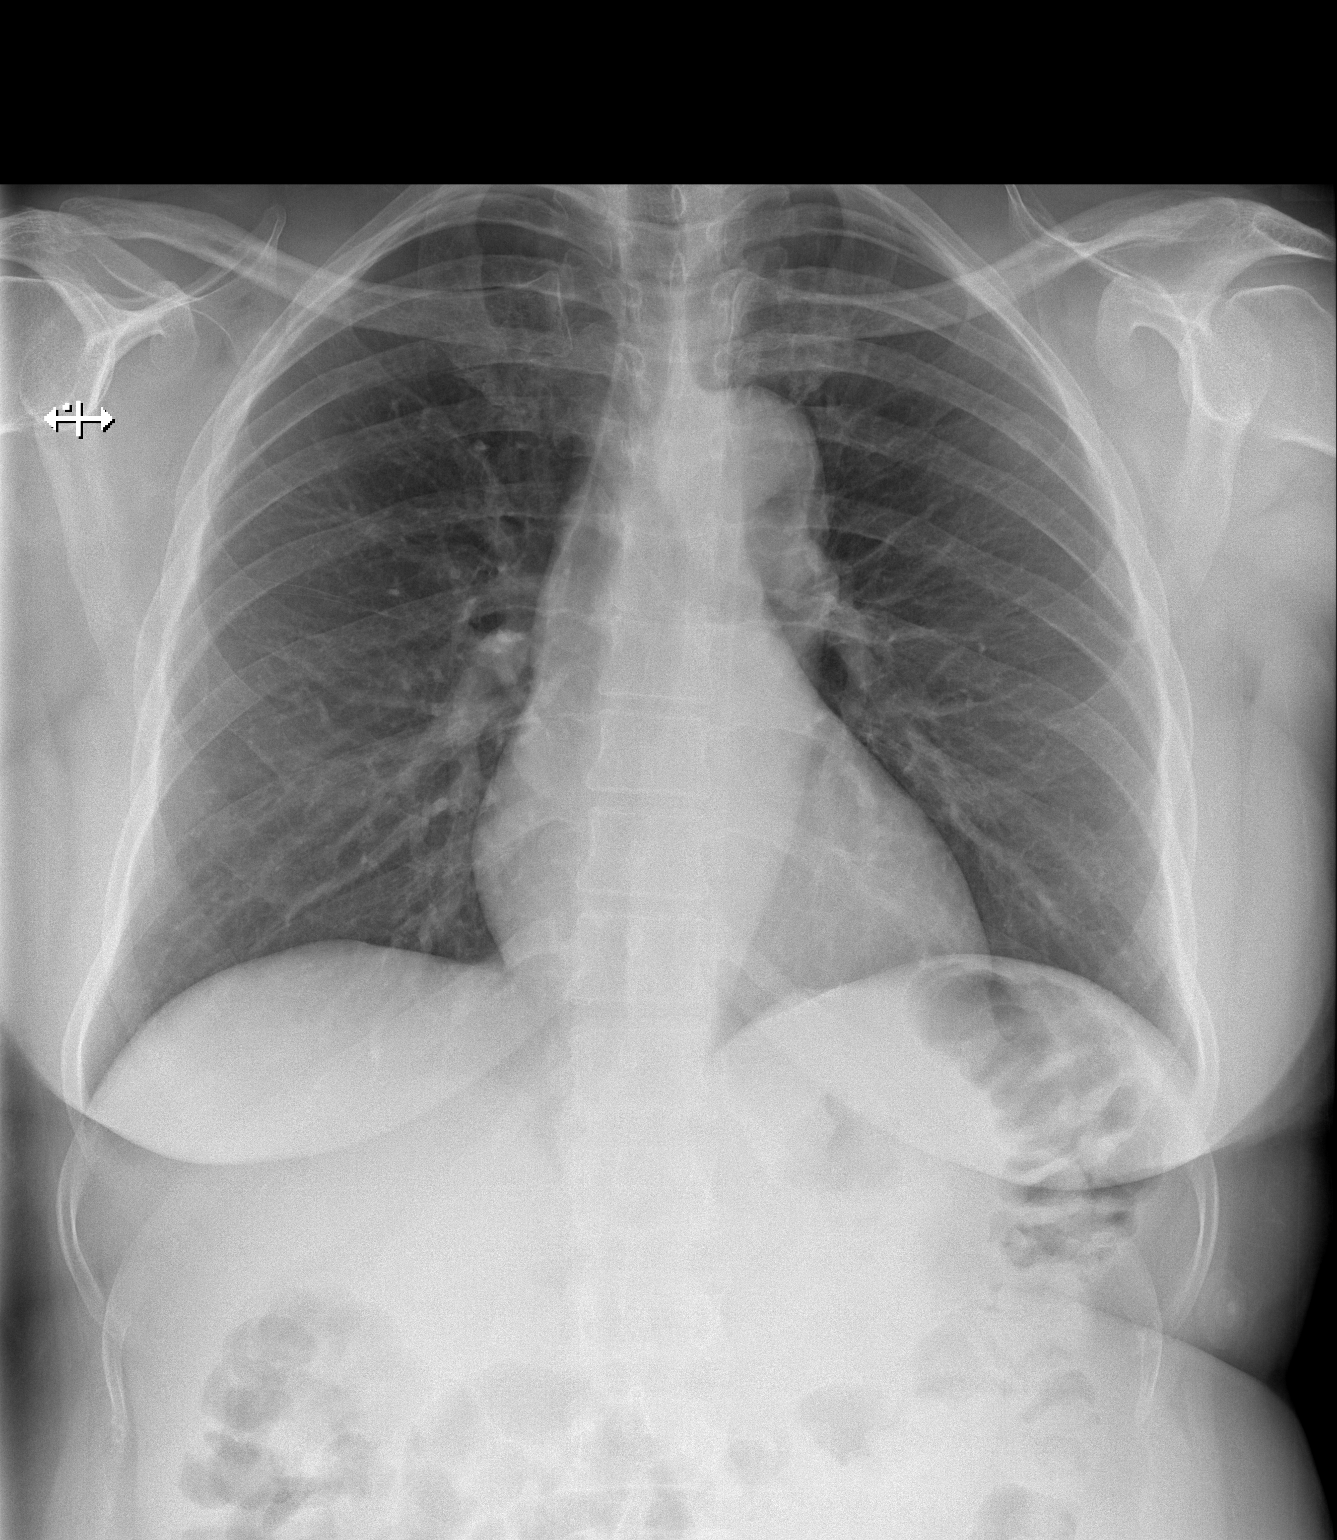

[w chest lat]
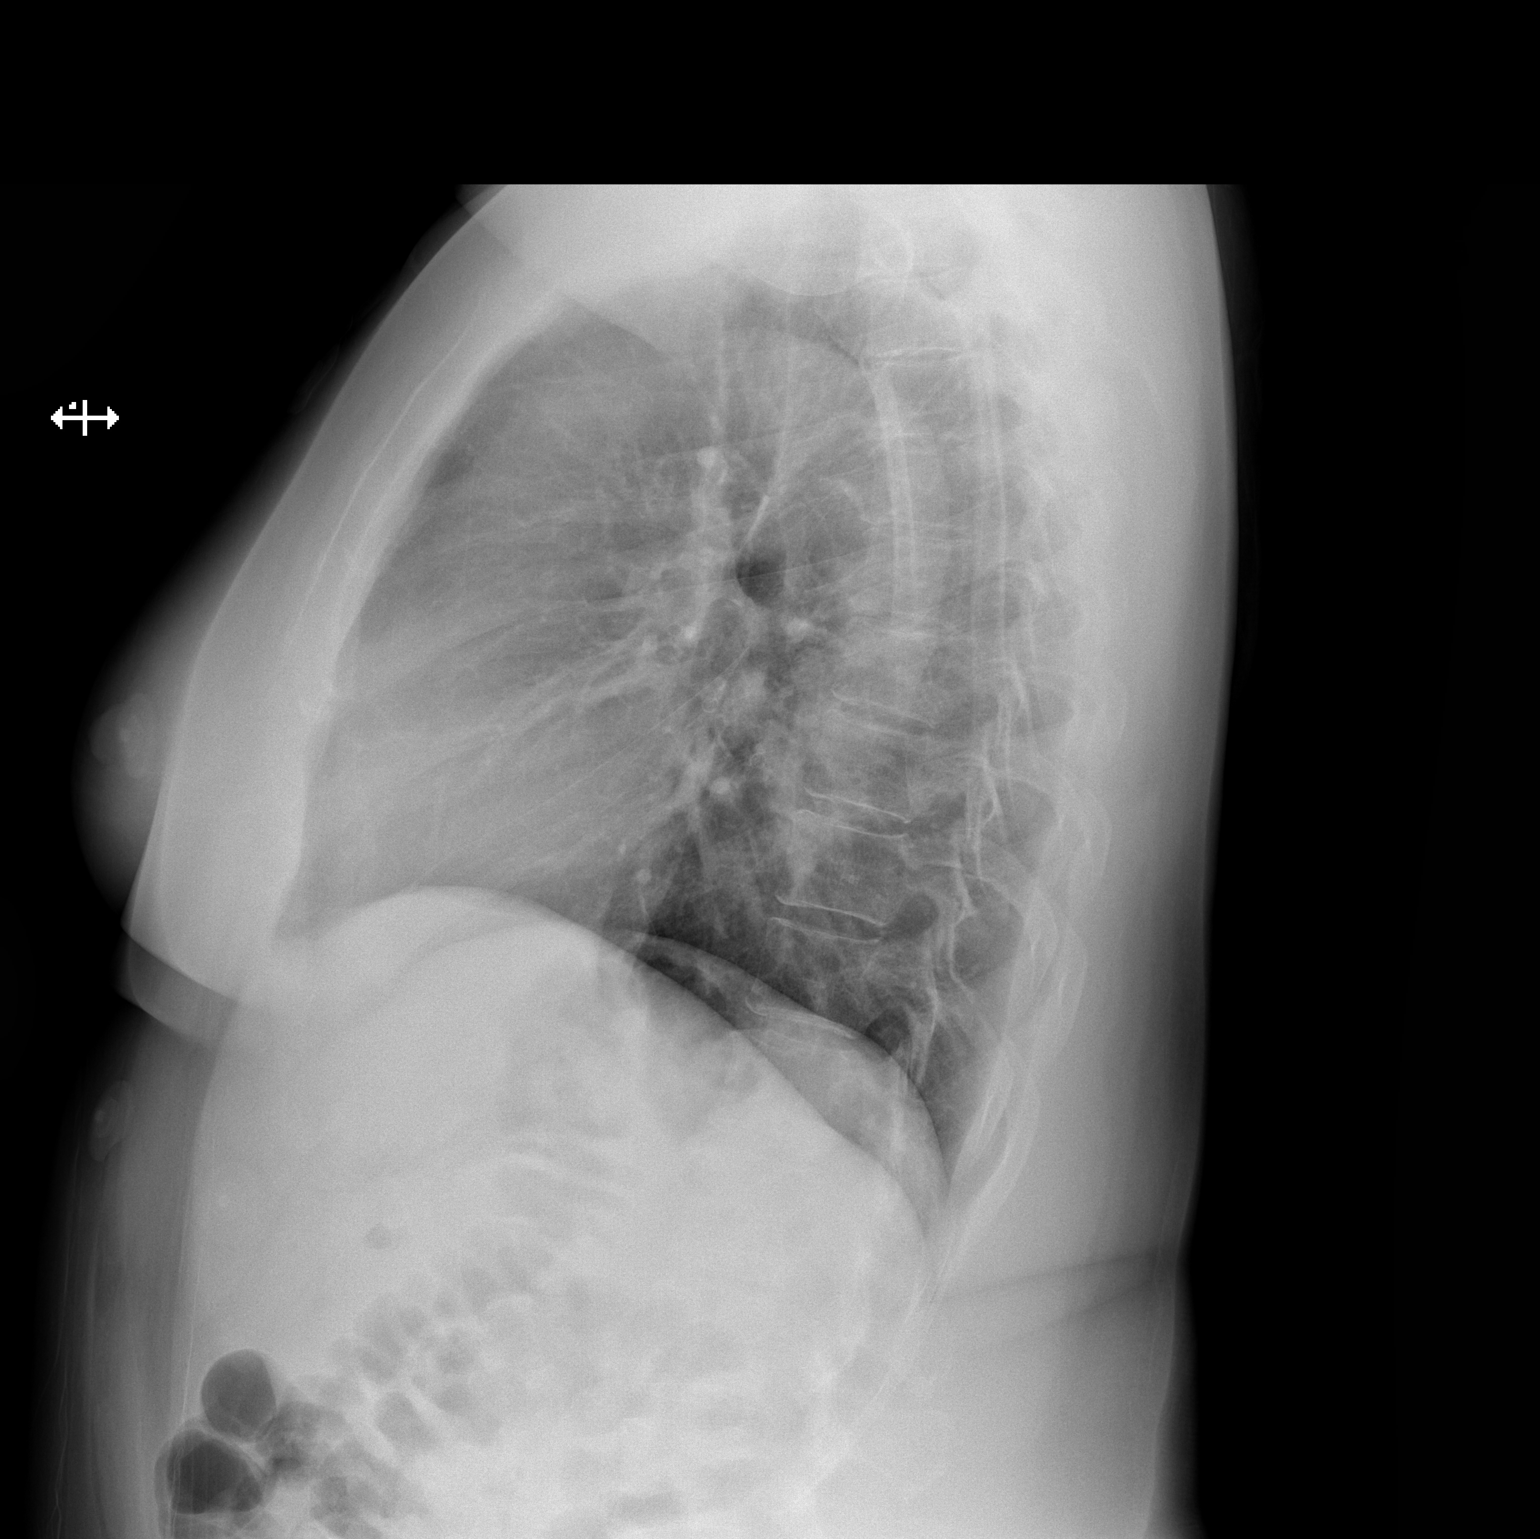

[2 of 2 positions shown; findings below may reference images not displayed]

FINDINGS: Upper normal heart size.

Mediastinal contours and pulmonary vascularity normal.

Atherosclerotic calcification aorta.

Faint density projecting over LEFT upper lobe likely an EKG lead.

No definite infiltrate, pleural effusion or pneumothorax.

Bones unremarkable.
IMPRESSION: No acute abnormalities.

## 2018-01-05 IMAGING — MR MR HEAD W/O CM
9 of 14 series · 26 of 48 positions shown · IV contrast (multihance)
Comparison: CT same day

CLINICAL DATA: Left arm numbness and left neck pain beginning 6
hours ago. Some dizziness.

EXAM:
MR HEAD WITHOUT CONTRAST
MR CIRCLE OF WILLIS WITHOUT CONTRAST
MRA OF THE NECK WITHOUT AND WITH CONTRAST
TECHNIQUE: Multiplanar, multiecho pulse sequences of the brain and surrounding
structures were obtained according to standard protocol without
intravenous contrast.; Multiplanar and multiecho pulse sequences of
the neck were obtained without and with intravenous contrast.
Angiographic images of the neck were obtained using MRA technique
without and with intravenous contast.; Angiographic images of the
Circle of Willis were obtained using MRA technique without
intravenous contrast.
CONTRAST:  18mL MULTIHANCE GADOBENATE DIMEGLUMINE 529 MG/ML IV SOLN

[Series 3: DWI · axial · 3.6mm · 0.94mm/px · z∈[-73,+74]mm · 4 of 86 slices shown (1 of 4)]
[im 1/86]
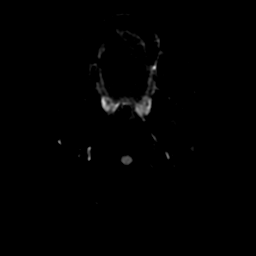
[im 29/86]
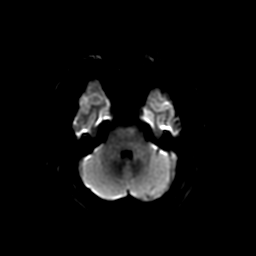
[im 57/86]
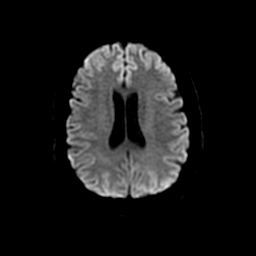
[im 86/86]
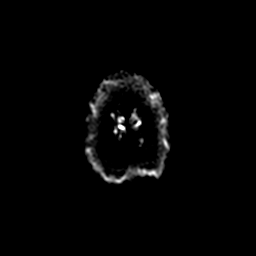

[Series 4: FLAIR · sagittal · 5.0mm · 0.47mm/px · 1 of 21 slices shown (1 of 2)]
[im 1/21]
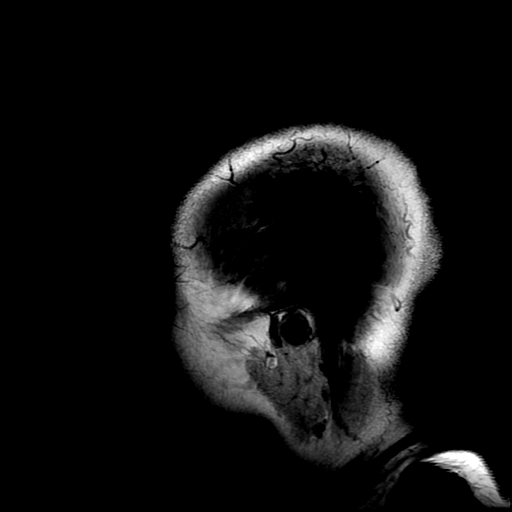

[Series 5: ax (id) 2 · axial · 1.0mm · 0.43mm/px · z∈[-88,+10]mm · 8 of 210 slices shown]
[im 1/210]
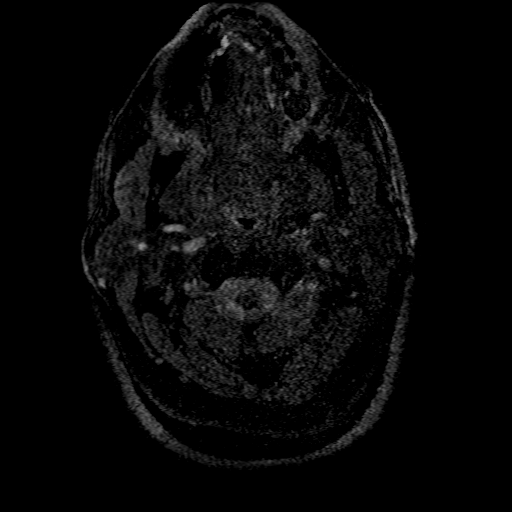
[im 24/210]
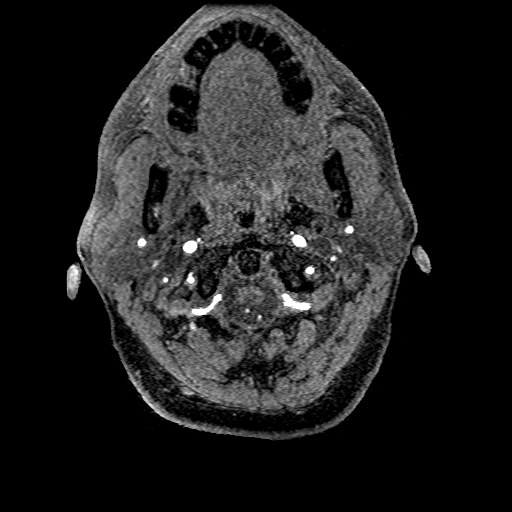
[im 70/210]
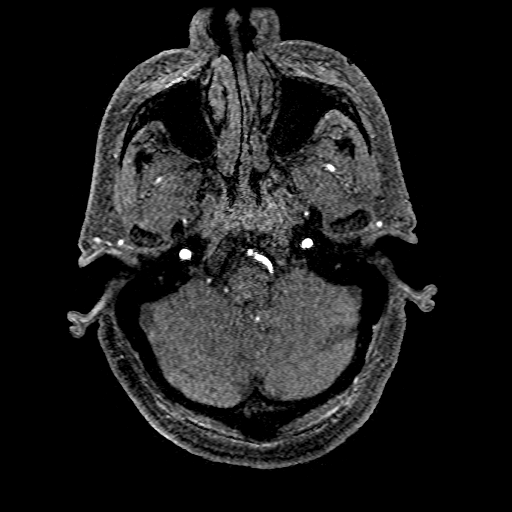
[im 93/210]
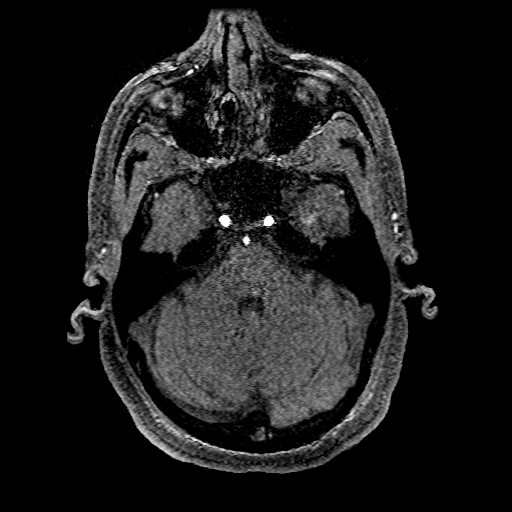
[im 117/210]
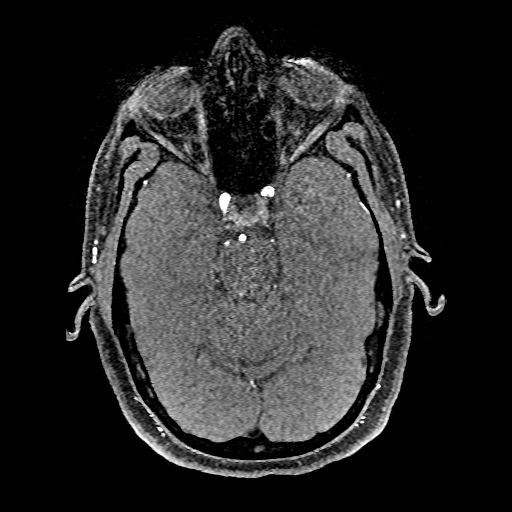
[im 140/210]
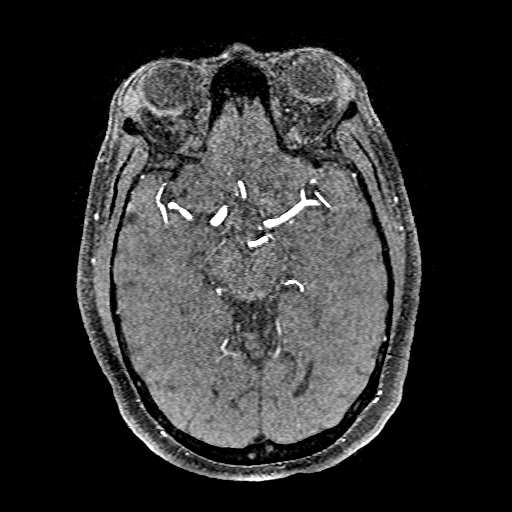
[im 186/210]
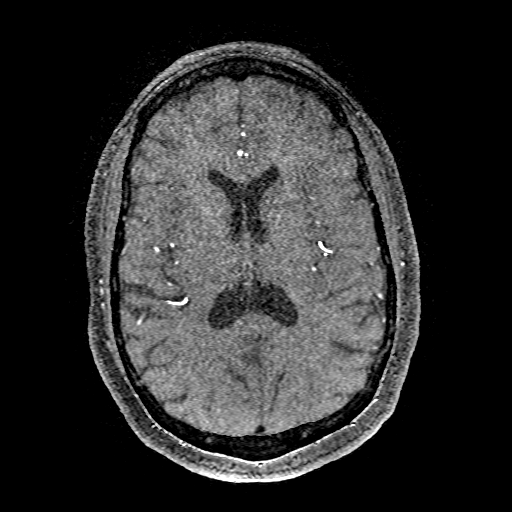
[im 210/210]
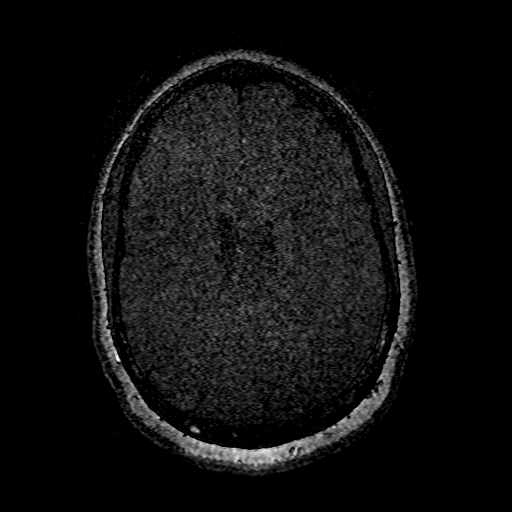

[Series 6: T2 · axial · 5.0mm · 0.47mm/px · 1 of 23 slices shown]
[im 1/23]
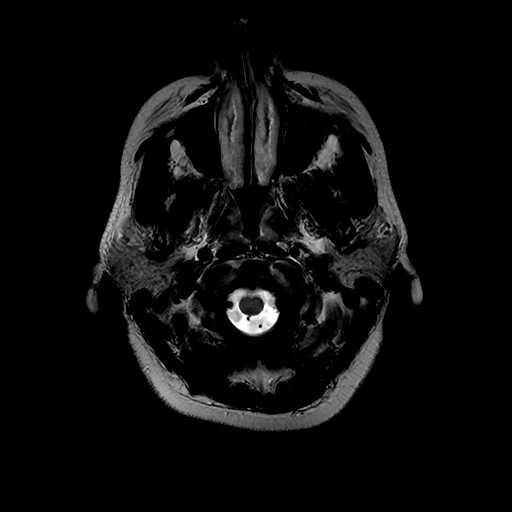

[Series 7: FLAIR · axial · 5.0mm · 0.47mm/px · 1 of 23 slices shown (2 of 2)]
[im 1/23]
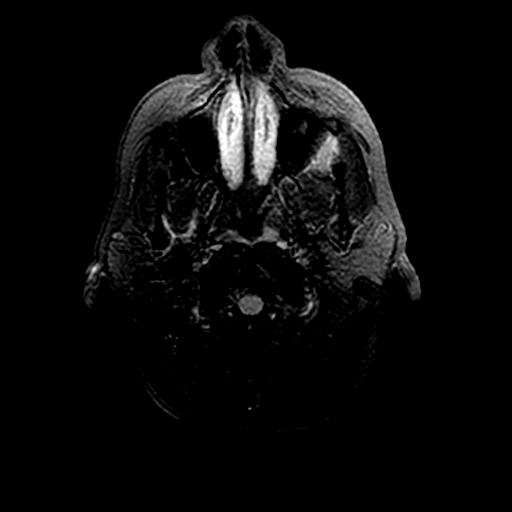

[Series 8: (person_name) · axial · 3.0mm · 0.47mm/px · z∈[-57,+43]mm · 4 of 92 slices shown]
[im 1/92]
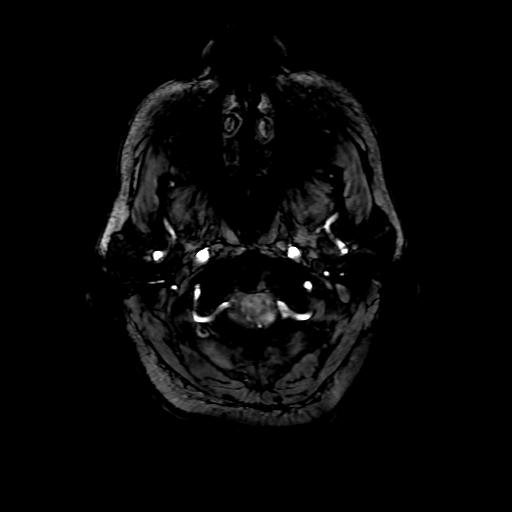
[im 23/92]
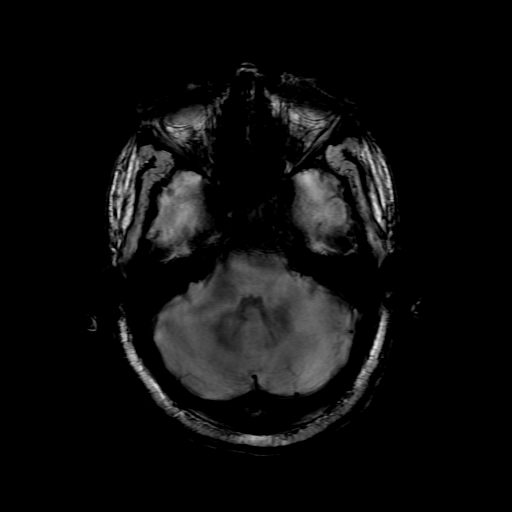
[im 46/92]
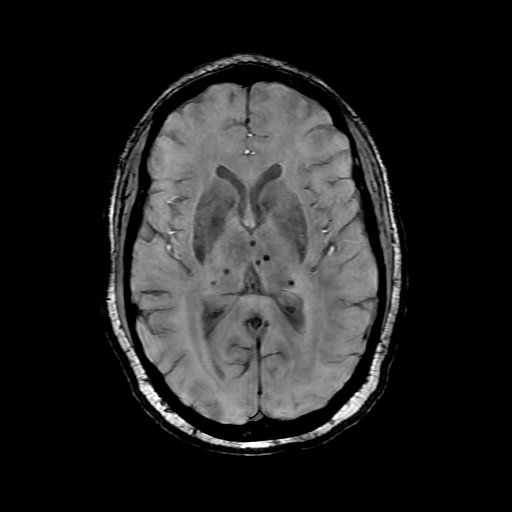
[im 69/92]
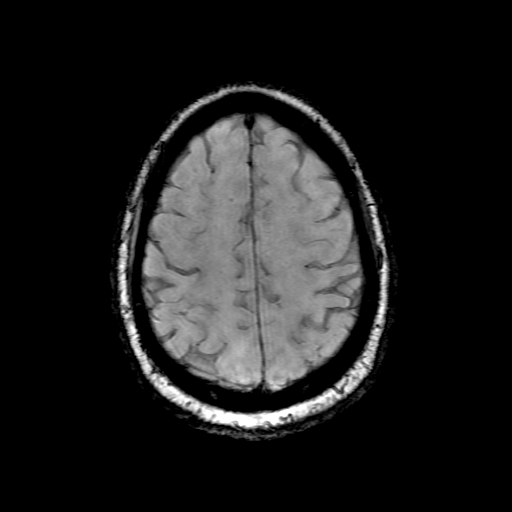

[Series 10: DWI · coronal · 5.0mm · 0.94mm/px · 3 of 68 slices shown (2 of 4)]
[im 1/68]
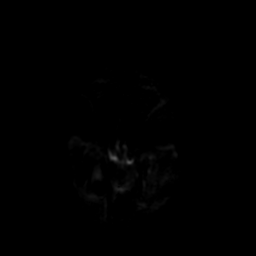
[im 34/68]
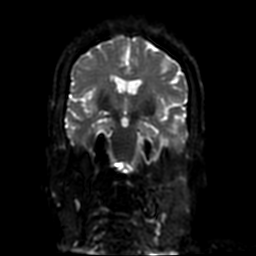
[im 68/68]
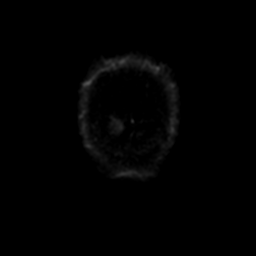

[Series 300: DWI · axial · 3.6mm · 0.94mm/px · z∈[-73,+74]mm · 2 of 43 slices shown (3 of 4)]
[im 1/43]
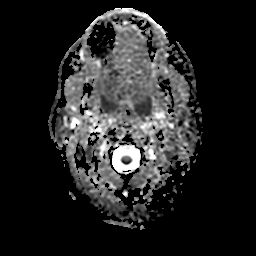
[im 43/43]
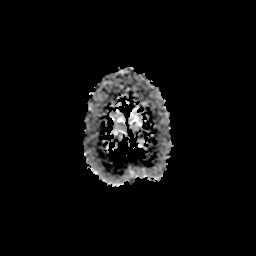

[Series 1000: DWI · coronal · 5.0mm · 0.94mm/px · 2 of 34 slices shown (4 of 4)]
[im 1/34]
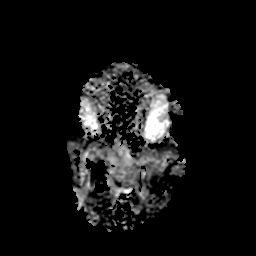
[im 34/34]
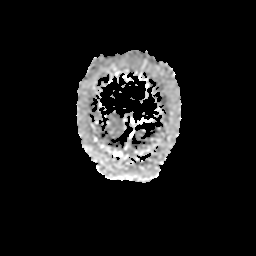

[26 of 48 positions shown; findings below may reference images not displayed]

FINDINGS: MR HEAD FINDINGS

Diffusion imaging does not show any acute or subacute infarction.
There are mild chronic appearing small vessel changes of the
brainstem, thalami and hemispheric white matter. Punctate foci of
hemosiderin are present in the region of those infarctions. No
cortical or large vessel territory infarction. No mass lesion, acute
hemorrhage, hydrocephalus or extra-axial collection. No pituitary
mass. No inflammatory sinus disease.

MR CIRCLE OF WILLIS FINDINGS

Both internal carotid arteries are widely patent into the brain. No
siphon stenosis. The anterior and middle cerebral vessels are normal
without proximal stenosis, aneurysm or vascular malformation. Both
vertebral arteries are normal with the left being dominant. No
basilar stenosis. Posterior circulation branch vessels are normal.
Right PCA takes a fetal origin from the anterior circulation.

MRA NECK FINDINGS

Branching pattern of the brachiocephalic vessels from the arch is
normal. No origin stenosis. Both common carotid arteries are widely
patent to their respective bifurcation. No carotid bifurcation
narrowing or irregularity. Cervical internal carotid arteries are
normal.

Both vertebral arteries are normal without origin stenosis. No
narrowing or irregularity.
IMPRESSION: No acute brain finding. Chronic small-vessel ischemic changes of a
mild degree. Punctate hemosiderin deposition associated with many of
the old small vessel insults. No sign of acute hemorrhage.

Normal intracranial MR angiography of the large and medium size
vessels.

Normal MR angiography of the neck vessels.
